# Patient Record
Sex: Male | Born: 1990
Health system: Southern US, Community
[De-identification: ages and names within clinical notes are randomized; demographics above are authoritative.]

## PROBLEM LIST (undated history)

## (undated) DIAGNOSIS — E669 Obesity, unspecified: Secondary | ICD-10-CM

## (undated) HISTORY — DX: Obesity, unspecified: E66.9

## (undated) HISTORY — PX: REPAIR CRANIAL DEFECT SIMPLE: SUR683

---
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
## 2013-05-31 HISTORY — PX: PILONIDAL CYST EXCISION: SHX744

## 2013-12-26 ENCOUNTER — Ambulatory Visit: Payer: Self-pay | Admitting: Surgery

## 2013-12-28 LAB — PATHOLOGY REPORT

## 2014-09-21 NOTE — Op Note (Signed)
PATIENT NAME:  Gerald Russell, Gerald Russell MR#:  [id number] DATE OF BIRTH:  04/24/1991  DATE OF PROCEDURE:  12/26/2013  OPERATION PERFORMED: Excision of pilonidal disease.   SURGEON: Claude MangesWilliam F Gwynneth Fabio, M.D.   ANESTHESIA: General.   PREOPERATIVE DIAGNOSIS: Pilonidal disease.   POSTOPERATIVE DIAGNOSIS:  Pilonidal disease.   PROCEDURE IN DETAIL: The patient was placed prone on the operating room table and prepped and draped in the usual sterile fashion. A narrow incision was made that encompassed all of the pilonidal disease including the multiple pits and the extruding hairs and the area of granulation tissue where he had spontaneously drained superiorly. This was carried down with a narrow excision of the disease, but included all of the disease (particularly where the hairs were slightly to the left superiorly) down to the sacral fascia. All of the disease was excised. Hemostasis was achieved with electrocautery. The wound was irrigated with copious amounts of warm normal saline. This was suctioned clear. There was no evidence of pus or purulence or soft tissue infection anywhere. Therefore the wound was closed in 3 layers with interrupted 3-0 Monocryl sutures deep, a running 3-0 Monocryl suture superficially, and a running subcuticular 3-0 PDS for the skin. Liquiderm was then added as the ultimate dressing. The patient tolerated the procedure well. There were no complications.    ____________________________ Claude MangesWilliam F. Saprina Chuong, MD wfm:ts D: 12/26/2013 09:21:08 ET T: 12/26/2013 11:05:28 ET JOB#: [id number]  cc: Claude MangesWilliam F. Claudie Brickhouse, MD, <Dictator> Claude MangesWILLIAM F Lige Lakeman MD ELECTRONICALLY SIGNED 12/27/2013 14:16

## 2015-05-22 ENCOUNTER — Encounter: Payer: Self-pay | Admitting: Family Medicine

## 2015-05-22 ENCOUNTER — Ambulatory Visit (INDEPENDENT_AMBULATORY_CARE_PROVIDER_SITE_OTHER): Payer: 59 | Admitting: Family Medicine

## 2015-05-22 VITALS — BP 120/92 | HR 72 | Ht 73.0 in | Wt 302.0 lb

## 2015-05-22 DIAGNOSIS — L600 Ingrowing nail: Secondary | ICD-10-CM | POA: Diagnosis not present

## 2015-05-22 MED ORDER — AMOXICILLIN-POT CLAVULANATE 875-125 MG PO TABS: 1.0000 | ORAL_TABLET | Freq: Two times a day (BID) | ORAL | Status: DC

## 2015-05-22 NOTE — Progress Notes (Signed)
Name: Gerald Russell   MRN: [id number]    DOB: 10/16/1990   Date:05/22/2015       Progress Note  Subjective  Chief Complaint  Chief Complaint  Patient presents with  . Toe Pain    Great toe on R) foot is infected x 2 weeks    Toe Pain  The incident occurred 5 to 7 days ago. There was no injury mechanism. The pain is present in the right toes. The pain is moderate. The pain has been fluctuating since onset. Pertinent negatives include no inability to bear weight, loss of motion, loss of sensation, muscle weakness, numbness or tingling. He has tried nothing for the symptoms. The treatment provided no relief.    No problem-specific assessment & plan notes found for this encounter.   No past medical history on file.  No past surgical history on file.  No family history on file.  Social History   Social History  . Marital Status: Single    Spouse Name: N/A  . Number of Children: N/A  . Years of Education: N/A   Occupational History  . Not on file.   Social History Main Topics  . Smoking status: Never Smoker   . Smokeless tobacco: Not on file  . Alcohol Use: 0.0 oz/week    0 Standard drinks or equivalent per week  . Drug Use: No  . Sexual Activity: Not on file   Other Topics Concern  . Not on file   Social History Narrative  . No narrative on file    Allergies  Allergen Reactions  . Hydrocodone      Review of Systems  Constitutional: Negative for fever, chills, weight loss and malaise/fatigue.  HENT: Negative for ear discharge, ear pain and sore throat.   Eyes: Negative for blurred vision.  Respiratory: Negative for cough, sputum production, shortness of breath and wheezing.   Cardiovascular: Negative for chest pain, palpitations and leg swelling.  Gastrointestinal: Negative for heartburn, nausea, abdominal pain, diarrhea, constipation, blood in stool and melena.  Genitourinary: Negative for dysuria, urgency, frequency and hematuria.  Musculoskeletal:  Negative for myalgias, back pain, joint pain and neck pain.  Skin: Negative for rash.  Neurological: Negative for dizziness, tingling, sensory change, focal weakness, numbness and headaches.  Endo/Heme/Allergies: Negative for environmental allergies and polydipsia. Does not bruise/bleed easily.  Psychiatric/Behavioral: Negative for depression and suicidal ideas. The patient is not nervous/anxious and does not have insomnia.      Objective  Filed Vitals:   05/22/15 1535  BP: 120/92  Pulse: 72  Height: 6\' 1"  (1.854 m)  Weight: 302 lb (136.986 kg)    Physical Exam  Pulmonary/Chest: He has no wheezes. He has no rales.  Musculoskeletal: He exhibits tenderness.  Skin: There is erythema.  Right great toe  Nursing note and vitals reviewed.     Assessment & Plan  Problem List Items Addressed This Visit    None    Visit Diagnoses    Ingrown right greater toenail    -  Primary    Relevant Medications    amoxicillin-clavulanate (AUGMENTIN) 875-125 MG tablet         Dr. Hayden Rasmusseneanna Jayen [facility]  05/22/2015

## 2015-05-30 ENCOUNTER — Ambulatory Visit (INDEPENDENT_AMBULATORY_CARE_PROVIDER_SITE_OTHER): Payer: 59 | Admitting: Family Medicine

## 2015-05-30 ENCOUNTER — Encounter: Payer: Self-pay | Admitting: Family Medicine

## 2015-05-30 VITALS — BP 120/84 | HR 70 | Ht 73.0 in | Wt 304.0 lb

## 2015-05-30 DIAGNOSIS — L600 Ingrowing nail: Secondary | ICD-10-CM | POA: Diagnosis not present

## 2015-05-30 MED ORDER — AMOXICILLIN-POT CLAVULANATE 875-125 MG PO TABS: 1.0000 | ORAL_TABLET | Freq: Two times a day (BID) | ORAL | Status: DC

## 2015-05-30 NOTE — Progress Notes (Signed)
Name: Gerald Russell   MRN: [id number]    DOB: 12/16/1990   Date:05/30/2015       Progress Note  Subjective  Chief Complaint  Chief Complaint  Patient presents with  . Foot Pain    L) great toe infected    Foot Pain This is a new problem. The current episode started in the past 7 days. The problem occurs daily. The problem has been gradually worsening. Pertinent negatives include no abdominal pain, chest pain, chills, coughing, fever, headaches, myalgias, nausea, neck pain, numbness, rash, sore throat or weakness. The symptoms are aggravated by walking (blunt trama). The treatment provided no relief.    No problem-specific assessment & plan notes found for this encounter.   History reviewed. No pertinent past medical history.  History reviewed. No pertinent past surgical history.  Family History  Problem Relation Age of Onset  . Cancer Father     Social History   Social History  . Marital Status: Single    Spouse Name: N/A  . Number of Children: N/A  . Years of Education: N/A   Occupational History  . Not on file.   Social History Main Topics  . Smoking status: Never Smoker   . Smokeless tobacco: Not on file  . Alcohol Use: 0.0 oz/week    0 Standard drinks or equivalent per week  . Drug Use: No  . Sexual Activity: Yes   Other Topics Concern  . Not on file   Social History Narrative    Allergies  Allergen Reactions  . Hydrocodone      Review of Systems  Constitutional: Negative for fever, chills, weight loss and malaise/fatigue.  HENT: Negative for ear discharge, ear pain and sore throat.   Eyes: Negative for blurred vision.  Respiratory: Negative for cough, sputum production, shortness of breath and wheezing.   Cardiovascular: Negative for chest pain, palpitations and leg swelling.  Gastrointestinal: Negative for heartburn, nausea, abdominal pain, diarrhea, constipation, blood in stool and melena.  Genitourinary: Negative for dysuria, urgency,  frequency and hematuria.  Musculoskeletal: Negative for myalgias, back pain, joint pain and neck pain.  Skin: Negative for rash.  Neurological: Negative for dizziness, tingling, sensory change, focal weakness, weakness, numbness and headaches.  Endo/Heme/Allergies: Negative for environmental allergies and polydipsia. Does not bruise/bleed easily.  Psychiatric/Behavioral: Negative for depression and suicidal ideas. The patient is not nervous/anxious and does not have insomnia.      Objective  Filed Vitals:   05/30/15 1358  BP: 120/84  Pulse: 70  Height: 6\' 1"  (1.854 m)  Weight: 304 lb (137.893 kg)    Physical Exam  Constitutional: He is oriented to person, place, and time and well-developed, well-nourished, and in no distress.  HENT:  Head: Normocephalic.  Right Ear: External ear normal.  Left Ear: External ear normal.  Nose: Nose normal.  Mouth/Throat: Oropharynx is clear and moist.  Eyes: Conjunctivae and EOM are normal. Pupils are equal, round, and reactive to light. Right eye exhibits no discharge. Left eye exhibits no discharge. No scleral icterus.  Neck: Normal range of motion. Neck supple. No JVD present. No tracheal deviation present. No thyromegaly present.  Cardiovascular: Normal rate, regular rhythm, normal heart sounds and intact distal pulses.  Exam reveals no gallop and no friction rub.   No murmur heard. Pulmonary/Chest: Breath sounds normal. No respiratory distress. He has no wheezes. He has no rales.  Abdominal: Soft. Bowel sounds are normal. He exhibits no mass. There is no hepatosplenomegaly. There is no tenderness.  There is no rebound, no guarding and no CVA tenderness.  Musculoskeletal: Normal range of motion. He exhibits tenderness. He exhibits no edema.       Feet:  Lymphadenopathy:    He has no cervical adenopathy.  Neurological: He is alert and oriented to person, place, and time. He has normal sensation, normal strength, normal reflexes and intact  cranial nerves. No cranial nerve deficit.  Skin: Skin is warm. No rash noted.  Psychiatric: Mood and affect normal.      Assessment & Plan  Problem List Items Addressed This Visit    None    Visit Diagnoses    Ingrown left greater toenail    -  Primary    Relevant Medications    amoxicillin-clavulanate (AUGMENTIN) 875-125 MG tablet    Ingrown right greater toenail        recheck         Dr. Elizabeth Sauer [facility]  05/30/2015

## 2015-08-18 ENCOUNTER — Encounter: Payer: Self-pay | Admitting: Family Medicine

## 2015-08-18 ENCOUNTER — Ambulatory Visit (INDEPENDENT_AMBULATORY_CARE_PROVIDER_SITE_OTHER): Payer: BLUE CROSS/BLUE SHIELD | Admitting: Family Medicine

## 2015-08-18 VITALS — BP 120/80 | HR 80 | Temp 98.7°F | Ht 73.0 in | Wt 306.0 lb

## 2015-08-18 DIAGNOSIS — J029 Acute pharyngitis, unspecified: Secondary | ICD-10-CM | POA: Diagnosis not present

## 2015-08-18 LAB — POCT RAPID STREP A (OFFICE): Rapid Strep A Screen: NEGATIVE

## 2015-08-18 MED ORDER — AZITHROMYCIN 250 MG PO TABS: ORAL_TABLET | ORAL | Status: DC

## 2015-08-18 NOTE — Progress Notes (Signed)
Name: Gerald Russell   MRN: [id number]    DOB: 07/03/1990   Date:08/18/2015       Progress Note  Subjective  Chief Complaint  Chief Complaint  Patient presents with  . Sore Throat    fever off and on x 5 days, loss of appetite- taking mucinex and alka-seltzer throughout day- helped some    Sore Throat  This is a new problem. The current episode started in the past 7 days. The problem has been gradually worsening. The maximum temperature recorded prior to his arrival was 100.4 - 100.9 F. The pain is at a severity of 3/10. The pain is mild. Associated symptoms include a hoarse voice. Pertinent negatives include no abdominal pain, coughing, diarrhea, ear discharge, ear pain, headaches, neck pain, shortness of breath, trouble swallowing or vomiting. Associated symptoms comments: nausea. He has had no exposure to strep. He has tried acetaminophen for the symptoms. The treatment provided mild relief.    No problem-specific assessment & plan notes found for this encounter.   History reviewed. No pertinent past medical history.  History reviewed. No pertinent past surgical history.  Family History  Problem Relation Age of Onset  . Cancer Father     Social History   Social History  . Marital Status: Single    Spouse Name: N/A  . Number of Children: N/A  . Years of Education: N/A   Occupational History  . Not on file.   Social History Main Topics  . Smoking status: Never Smoker   . Smokeless tobacco: Not on file  . Alcohol Use: 0.0 oz/week    0 Standard drinks or equivalent per week  . Drug Use: No  . Sexual Activity: Yes   Other Topics Concern  . Not on file   Social History Narrative    Allergies  Allergen Reactions  . Hydrocodone      Review of Systems  Constitutional: Negative for fever, chills, weight loss and malaise/fatigue.  HENT: Positive for hoarse voice. Negative for ear discharge, ear pain, sore throat and trouble swallowing.   Eyes: Negative for  blurred vision.  Respiratory: Negative for cough, sputum production, shortness of breath and wheezing.   Cardiovascular: Negative for chest pain, palpitations and leg swelling.  Gastrointestinal: Negative for heartburn, nausea, vomiting, abdominal pain, diarrhea, constipation, blood in stool and melena.  Genitourinary: Negative for dysuria, urgency, frequency and hematuria.  Musculoskeletal: Negative for myalgias, back pain, joint pain and neck pain.  Skin: Negative for rash.  Neurological: Negative for dizziness, tingling, sensory change, focal weakness and headaches.  Endo/Heme/Allergies: Negative for environmental allergies and polydipsia. Does not bruise/bleed easily.  Psychiatric/Behavioral: Negative for depression and suicidal ideas. The patient is not nervous/anxious and does not have insomnia.      Objective  Filed Vitals:   08/18/15 1524  BP: 120/80  Pulse: 80  Temp: 98.7 F (37.1 C)  TempSrc: Oral  Height: 6\' 1"  (1.854 m)  Weight: 306 lb (138.801 kg)    Physical Exam  Constitutional: He is oriented to person, place, and time and well-developed, well-nourished, and in no distress.  HENT:  Head: Normocephalic.  Right Ear: External ear normal.  Left Ear: External ear normal.  Nose: Nose normal.  Mouth/Throat: Oropharyngeal exudate and posterior oropharyngeal erythema present.  Eyes: Conjunctivae and EOM are normal. Pupils are equal, round, and reactive to light. Right eye exhibits no discharge. Left eye exhibits no discharge. No scleral icterus.  Neck: Normal range of motion. Neck supple. No JVD present.  No tracheal deviation present. No thyromegaly present.  Cardiovascular: Normal rate, regular rhythm, normal heart sounds and intact distal pulses.  Exam reveals no gallop and no friction rub.   No murmur heard. Pulmonary/Chest: Breath sounds normal. No respiratory distress. He has no wheezes. He has no rales.  Abdominal: Soft. Bowel sounds are normal. He exhibits no  mass. There is no hepatosplenomegaly. There is no tenderness. There is no rebound, no guarding and no CVA tenderness.  Musculoskeletal: Normal range of motion. He exhibits no edema or tenderness.  Lymphadenopathy:    He has no cervical adenopathy.  Neurological: He is alert and oriented to person, place, and time. He has normal sensation, normal strength, normal reflexes and intact cranial nerves. No cranial nerve deficit.  Skin: Skin is warm. No rash noted.  Psychiatric: Mood and affect normal.  Nursing note and vitals reviewed.     Assessment & Plan  Problem List Items Addressed This Visit    None    Visit Diagnoses    Pharyngitis    -  Primary    Relevant Medications    azithromycin (ZITHROMAX) 250 MG tablet    Other Relevant Orders    POCT rapid strep A (Completed)    Monospot         Dr. [facility]den [facility]  08/18/2015

## 2015-08-18 NOTE — Patient Instructions (Signed)
Infectious Mononucleosis °Infectious mononucleosis is an infection caused by a virus. This illness is often called "mono." It causes symptoms that affect various areas of the body, including the throat, upper air passages, and lymph glands. The liver or spleen may also be affected. °The virus spreads from person to person through close contact. The illness is usually not serious and often goes away in 2-4 weeks without treatment. In rare cases, symptoms can be more severe and last longer, sometimes up to several months. Because the illness can sometimes cause the liver or spleen to become enlarged, you should not participate in contact sports or strenuous exercise until your health care provider approves. °CAUSES  °Infectious mononucleosis is caused by the Epstein-Barr virus. This virus spreads through contact with an infected person's saliva or other bodily fluids. It is often spread through kissing. It may also spread through coughing or sharing utensils or drinking glasses that were recently used by an infected person. An infected person will not always appear ill but can still spread the virus. °RISK FACTORS °This illness is most common in adolescents and young adults. °SIGNS AND SYMPTOMS  °The most common symptoms of infectious mononucleosis are: °· Sore throat.   °· Headache.   °· Fatigue.   °· Muscle aches.   °· Swollen glands.   °· Fever.   °· Poor appetite.   °· Enlarged liver or spleen.   °Some less common symptoms that can also occur include: °· Rash. This is more common if you take antibiotic medicines. °· Feeling sick to your stomach (nauseous).   °· Abdominal pain.   °DIAGNOSIS  °Your health care provider will take your medical history and do a physical exam. Blood tests can be done to confirm the diagnosis.  °TREATMENT  °Infectious mononucleosis usually goes away on its own with time. It cannot be cured with medicines, but medicines are sometimes used to relieve symptoms. Steroid medicine is sometimes  needed if the swelling in the throat causes breathing or swallowing problems. Treatment in a hospital is sometimes needed for severe cases.  °HOME CARE INSTRUCTIONS  °· Rest as needed.   °· Do not participate in contact sports, strenuous exercise, or heavy lifting until your health care provider approves. The liver and spleen could be seriously injured if they are enlarged from the illness. You may need to wait a couple months before participating in sports.   °· Drink enough fluid to keep your urine clear or pale yellow.   °· Do not drink alcohol. °· Take medicines only as directed by your health care provider. Children under 18 years of age should not take aspirin because of the association with Reye syndrome.   °· Eat soft foods. Cold foods such as ice cream or frozen ice pops can soothe a sore throat. °· If you have a sore throat, gargle with a mixture of salt and water. This may help relieve your discomfort. Mix 1 tsp of salt in 1 cup of warm water. Sucking on hard candy may also help.   °· Start regular activities gradually after the fever is gone. Be sure to rest when tired.   °· Avoid kissing or sharing utensils or drinking glasses until your health care provider tells you that you are no longer contagious.   °PREVENTION  °To avoid spreading the virus, do not kiss anyone or share utensils, drinking glasses, or food until your health care provider tells you that you are no longer contagious. °SEEK MEDICAL CARE IF:  °· Your fever is not gone after 10 days. °· You have swollen lymph nodes that are not   back to normal after 4 weeks. °· Your activity level is not back to normal after 2 months.   °· You have yellow coloring to your eyes and skin (jaundice). °· You have constipation.   °SEEK IMMEDIATE MEDICAL CARE IF:  °· You have severe pain in the abdomen or shoulder. °· You are drooling. °· You have trouble swallowing. °· You have trouble breathing. °· You develop a stiff neck. °· You develop a severe  headache. °· You cannot stop throwing up (vomiting). °· You have convulsions. °· You are confused. °· You have trouble with balance. °· You have signs of dehydration. These may include: °¨ Weakness. °¨ Sunken eyes. °¨ Pale skin. °¨ Dry mouth. °¨ Rapid breathing or pulse. °  °This information is not intended to replace advice given to you by your health care provider. Make sure you discuss any questions you have with your health care provider. °  °Document Released: 05/14/2000 Document Revised: 06/07/2014 Document Reviewed: 01/22/2014 °Elsevier Interactive Patient Education ©2016 [employer] ° °

## 2015-08-19 LAB — MONONUCLEOSIS SCREEN: Mono Screen: NEGATIVE

## 2018-01-31 ENCOUNTER — Encounter: Payer: Self-pay | Admitting: Family Medicine

## 2018-01-31 ENCOUNTER — Ambulatory Visit: Payer: BLUE CROSS/BLUE SHIELD | Admitting: Family Medicine

## 2018-01-31 VITALS — BP 120/84 | HR 80 | Ht 73.0 in | Wt 294.0 lb

## 2018-01-31 DIAGNOSIS — L723 Sebaceous cyst: Secondary | ICD-10-CM

## 2018-01-31 DIAGNOSIS — Z7689 Persons encountering health services in other specified circumstances: Secondary | ICD-10-CM

## 2018-01-31 MED ORDER — MUPIROCIN 2 % EX OINT
1.0000 | TOPICAL_OINTMENT | Freq: Two times a day (BID) | CUTANEOUS | 0 refills | Status: DC
Start: 2018-01-31 — End: 2018-03-14

## 2018-01-31 MED ORDER — CEPHALEXIN 500 MG PO CAPS
500.0000 mg | ORAL_CAPSULE | Freq: Four times a day (QID) | ORAL | 0 refills | Status: DC
Start: 2018-01-31 — End: 2018-03-14

## 2018-01-31 NOTE — Progress Notes (Signed)
Name: Gerald Russell   MRN: [id number]    DOB: 1991/03/11   Date:01/31/2018       Progress Note  Subjective  Chief Complaint  Chief Complaint  Patient presents with  . Cyst    under L) armpit- "going down, doesn't hurt"    Patient noted sebaceous cyst in left axillae. Purulence expressed. Dressing applied.    No problem-specific Assessment & Plan notes found for this encounter.   History reviewed. No pertinent past medical history.  History reviewed. No pertinent surgical history.  Family History  Problem Relation Age of Onset  . Cancer Father     Social History   Socioeconomic History  . Marital status: Single    Spouse name: Not on file  . Number of children: Not on file  . Years of education: Not on file  . Highest education level: Not on file  Occupational History  . Not on file  Social Needs  . Financial resource strain: Not on file  . Food insecurity:    Worry: Not on file    Inability: Not on file  . Transportation needs:    Medical: Not on file    Non-medical: Not on file  Tobacco Use  . Smoking status: Never Smoker  . Smokeless tobacco: Never Used  Substance and Sexual Activity  . Alcohol use: Yes    Alcohol/week: 0.0 standard drinks  . Drug use: No  . Sexual activity: Yes  Lifestyle  . Physical activity:    Days per week: Not on file    Minutes per session: Not on file  . Stress: Not on file  Relationships  . Social connections:    Talks on phone: Not on file    Gets together: Not on file    Attends religious service: Not on file    Active member of club or organization: Not on file    Attends meetings of clubs or organizations: Not on file    Relationship status: Not on file  . Intimate partner violence:    Fear of current or ex partner: Not on file    Emotionally abused: Not on file    Physically abused: Not on file    Forced sexual activity: Not on file  Other Topics Concern  . Not on file  Social History Narrative  . Not on  file    Allergies  Allergen Reactions  . Hydrocodone     Outpatient Medications Prior to Visit  Medication Sig Dispense Refill  . azithromycin (ZITHROMAX) 250 MG tablet 2 today then 1 a day for 4 days 6 tablet 0   No facility-administered medications prior to visit.     Review of Systems  Constitutional: Negative for chills, fever, malaise/fatigue and weight loss.  HENT: Negative for ear discharge, ear pain and sore throat.   Eyes: Negative for blurred vision.  Respiratory: Negative for cough, sputum production, shortness of breath and wheezing.   Cardiovascular: Negative for chest pain, palpitations and leg swelling.  Gastrointestinal: Negative for abdominal pain, blood in stool, constipation, diarrhea, heartburn, melena and nausea.  Genitourinary: Negative for dysuria, frequency, hematuria and urgency.  Musculoskeletal: Negative for back pain, joint pain, myalgias and neck pain.  Skin: Negative for rash.  Neurological: Negative for dizziness, tingling, sensory change, focal weakness and headaches.  Endo/Heme/Allergies: Negative for environmental allergies and polydipsia. Does not bruise/bleed easily.  Psychiatric/Behavioral: Negative for depression and suicidal ideas. The patient is not nervous/anxious and does not have insomnia.  Objective  Vitals:   01/31/18 1435  BP: 120/84  Pulse: 80  Weight: 294 lb (133.4 kg)  Height: 6\' 1"  (1.854 m)    Physical Exam  Constitutional: He is oriented to person, place, and time.  HENT:  Head: Normocephalic.  Right Ear: External ear normal.  Left Ear: External ear normal.  Nose: Nose normal.  Mouth/Throat: Oropharynx is clear and moist.  Eyes: Pupils are equal, round, and reactive to light. Conjunctivae and EOM are normal. Right eye exhibits no discharge. Left eye exhibits no discharge. No scleral icterus.  Neck: Normal range of motion. Neck supple. No JVD present. No tracheal deviation present. No thyromegaly present.   Cardiovascular: Normal rate, regular rhythm, normal heart sounds and intact distal pulses. Exam reveals no gallop and no friction rub.  No murmur heard. Pulmonary/Chest: Breath sounds normal. No respiratory distress. He has no wheezes. He has no rales.  Abdominal: Soft. Bowel sounds are normal. He exhibits no mass. There is no hepatosplenomegaly. There is no tenderness. There is no rebound, no guarding and no CVA tenderness.  Musculoskeletal: Normal range of motion. He exhibits no edema or tenderness.  Lymphadenopathy:    He has no cervical adenopathy.  Neurological: He is alert and oriented to person, place, and time. He has normal strength and normal reflexes. No cranial nerve deficit.  Skin: Skin is warm. No rash noted.  Nursing note and vitals reviewed.     Assessment & Plan  Problem List Items Addressed This Visit    None    Visit Diagnoses    Establishing care with new doctor, encounter for    -  Primary   Sebaceous cyst of left axilla       Small secondary infected sebaceous cyst note in left axillary area. Treat with cephalexin 500 mg qid/bactroban.   Relevant Medications   cephALEXin (KEFLEX) 500 MG capsule   mupirocin ointment (BACTROBAN) 2 %      Meds ordered this encounter  Medications  . cephALEXin (KEFLEX) 500 MG capsule    Sig: Take 1 capsule (500 mg total) by mouth 4 (four) times daily.    Dispense:  20 capsule    Refill:  0  . mupirocin ointment (BACTROBAN) 2 %    Sig: Apply 1 application topically 2 (two) times daily.    Dispense:  22 g    Refill:  0      Dr. Hayden [facility]  01/31/18

## 2018-03-14 ENCOUNTER — Other Ambulatory Visit: Payer: Self-pay

## 2018-03-14 ENCOUNTER — Encounter: Payer: Self-pay | Admitting: Emergency Medicine

## 2018-03-14 ENCOUNTER — Ambulatory Visit
Admission: EM | Admit: 2018-03-14 | Discharge: 2018-03-14 | Disposition: A | Discharge status: Home / Self Care | Payer: BLUE CROSS/BLUE SHIELD | Attending: Family Medicine | Admitting: Family Medicine

## 2018-03-14 DIAGNOSIS — B354 Tinea corporis: Secondary | ICD-10-CM

## 2018-03-14 MED ORDER — CLOTRIMAZOLE 1 % EX CREA: TOPICAL_CREAM | CUTANEOUS | 0 refills | Status: DC

## 2018-03-14 NOTE — Discharge Instructions (Signed)
Topical treatment for 2 weeks.  Take care  Dr. Adriana Simas

## 2018-03-14 NOTE — ED Provider Notes (Signed)
[facility]  CSN: [id number] Arrival date & time: 03/14/18  1504  History   Chief Complaint Chief Complaint  Patient presents with  . Rash   HPI  27 year old male presents with rash.  Patient noted an area on the center of his chest on Saturday.  Round.  Slightly raised.  No itching or pain.  No new exposures or changes.  No medications or interventions tried.  No other associated symptoms.  No other complaints.  Hx reviewed and updated as below. PMH: Hx of ingrown toenail, Sebaceous cyst  Family History Family History  Problem Relation Age of Onset  . Cancer Father    Social History Social History   Tobacco Use  . Smoking status: Never Smoker  . Smokeless tobacco: Never Used  Substance Use Topics  . Alcohol use: Not Currently    Alcohol/week: 0.0 standard drinks  . Drug use: Yes    Types: Marijuana   Allergies   Hydrocodone  Review of Systems Review of Systems  Constitutional: Negative.   Skin: Positive for rash.   Physical Exam Triage Vital Signs ED Triage Vitals  Enc Vitals Group     BP 03/14/18 1520 122/88     Pulse Rate 03/14/18 1520 83     Resp 03/14/18 1520 18     Temp 03/14/18 1520 98.8 F (37.1 C)     Temp Source 03/14/18 1520 Oral     SpO2 03/14/18 1520 100 %     Weight 03/14/18 1521 280 lb (127 kg)     Height 03/14/18 1521 6\' 2"  (1.88 m)     Head Circumference --      Peak Flow --      Pain Score 03/14/18 1521 0     Pain Loc --      Pain Edu? --      Excl. in GC? --    Updated Vital Signs BP 122/88 (BP Location: Left Arm)   Pulse 83   Temp 98.8 F (37.1 C) (Oral)   Resp 18   Ht 6\' 2"  (1.88 m)   Wt 127 kg   SpO2 100%   BMI 35.95 kg/m   Visual Acuity Right Eye Distance:   Left Eye Distance:   Bilateral Distance:    Right Eye Near:   Left Eye Near:    Bilateral Near:     Physical Exam  Constitutional: He is oriented to person, place, and time. He appears well-developed. No distress.  HENT:  Head:  Normocephalic and atraumatic.  Cardiovascular: Normal rate and regular rhythm.  Pulmonary/Chest: Effort normal and breath sounds normal.  Neurological: He is alert and oriented to person, place, and time.  Skin:  Anterior chest - small circular lesion with raised border.  Psychiatric: He has a normal mood and affect. His behavior is normal.  Nursing note and vitals reviewed.  UC Treatments / Results  Labs (all labs ordered are listed, but only abnormal results are displayed) Labs Reviewed - No data to display  EKG None  Radiology No results found.  Procedures Procedures (including critical care time)  Medications Ordered in UC Medications - No data to display  Initial Impression / Assessment and Plan / UC Course  I have reviewed the triage vital signs and the nursing notes.  Pertinent labs & imaging results that were available during my care of the patient were reviewed by me and considered in my medical decision making (see chart for details).    26 year old male presents  with tinea corporis. Treating with topical clotrimazole.   Final Clinical Impressions(s) / UC Diagnoses   Final diagnoses:  Tinea corporis     Discharge Instructions     Topical treatment for 2 weeks.  Take care  Dr. Adriana Simas     ED Prescriptions    Medication Sig Dispense Auth. Provider   clotrimazole (LOTRIMIN) 1 % cream Apply to affected area 2 times daily 60 g Tommie Sams, DO     Controlled Substance Prescriptions  Controlled Substance Registry consulted? Not Applicable   Tommie Sams, DO 03/14/18 1740

## 2018-03-14 NOTE — ED Triage Notes (Signed)
Patient states he thinks he has ringworm on the middle of chest that started on Saturday.

## 2018-04-17 ENCOUNTER — Ambulatory Visit: Payer: BLUE CROSS/BLUE SHIELD | Admitting: Family Medicine

## 2018-04-17 ENCOUNTER — Encounter: Payer: Self-pay | Admitting: Family Medicine

## 2018-04-17 VITALS — BP 110/80 | HR 80 | Temp 103.0°F | Ht 74.0 in | Wt 277.0 lb

## 2018-04-17 DIAGNOSIS — R509 Fever, unspecified: Secondary | ICD-10-CM | POA: Diagnosis not present

## 2018-04-17 LAB — POCT URINALYSIS DIPSTICK
BILIRUBIN UA: NEGATIVE
Blood, UA: NEGATIVE
GLUCOSE UA: NEGATIVE
KETONES UA: NEGATIVE
Leukocytes, UA: NEGATIVE
Nitrite, UA: NEGATIVE
Protein, UA: POSITIVE — AB
Spec Grav, UA: 1.01 (ref 1.010–1.025)
UROBILINOGEN UA: 0.2 U/dL
pH, UA: 7 (ref 5.0–8.0)

## 2018-04-17 LAB — POCT INFLUENZA A/B
Influenza A, POC: NEGATIVE
Influenza B, POC: NEGATIVE

## 2018-04-17 LAB — POCT RAPID STREP A (OFFICE): Rapid Strep A Screen: POSITIVE — AB

## 2018-04-17 MED ORDER — OSELTAMIVIR PHOSPHATE 75 MG PO CAPS: 75.0000 mg | ORAL_CAPSULE | Freq: Two times a day (BID) | ORAL | 0 refills | Status: DC

## 2018-04-17 MED ORDER — AMOXICILLIN-POT CLAVULANATE 875-125 MG PO TABS: 1.0000 | ORAL_TABLET | Freq: Two times a day (BID) | ORAL | 0 refills | Status: DC

## 2018-04-17 NOTE — Progress Notes (Signed)
Date:  04/17/2018   Name:  Gerald NormanJoseph Crawford Russell   DOB:  08/07/1990   MRN:  [id number]   Chief Complaint: Cough (with green production/ pain on lower R) side and fever of 102) Cough  This is a new problem. The current episode started today. The problem has been unchanged. The cough is productive of purulent sputum (yellow/green). Associated symptoms include chest pain, chills, a fever, myalgias, postnasal drip, a sore throat, shortness of breath and sweats. Pertinent negatives include no ear congestion, ear pain, headaches, heartburn, hemoptysis, nasal congestion, rash, rhinorrhea, weight loss or wheezing. Associated symptoms comments: Right lateral pain. He has tried steroid inhaler for the symptoms. The treatment provided moderate relief. There is no history of asthma, bronchiectasis, bronchitis, COPD, emphysema, environmental allergies or pneumonia.  Fever   This is a new problem. The current episode started yesterday. The problem occurs constantly. The problem has been waxing and waning. The maximum temperature noted was 103 to 103.9 F. Associated symptoms include chest pain, coughing and a sore throat. Pertinent negatives include no abdominal pain, diarrhea, ear pain, headaches, nausea, rash, urinary pain or wheezing. The treatment provided moderate relief.  Risk factors comment:  "popped a bump" inside right thigh. on wednesday Sore Throat   This is a new problem. The current episode started yesterday. The problem has been gradually worsening. The pain is worse on the right side. The maximum temperature recorded prior to his arrival was 103 - 104 F. The fever has been present for less than 1 day. The pain is at a severity of 3/10. The pain is mild. Associated symptoms include coughing and shortness of breath. Pertinent negatives include no abdominal pain, diarrhea, drooling, ear discharge, ear pain, headaches or neck pain.     Review of Systems  Constitutional: Positive for chills and  fever. Negative for weight loss.  HENT: Positive for postnasal drip and sore throat. Negative for drooling, ear discharge, ear pain, facial swelling and rhinorrhea.   Respiratory: Positive for cough and shortness of breath. Negative for hemoptysis and wheezing.   Cardiovascular: Positive for chest pain. Negative for palpitations and leg swelling.  Gastrointestinal: Negative for abdominal pain, blood in stool, constipation, diarrhea, heartburn and nausea.  Endocrine: Negative for polydipsia.  Genitourinary: Negative for dysuria, frequency, hematuria and urgency.  Musculoskeletal: Positive for myalgias. Negative for back pain and neck pain.  Skin: Negative for rash.  Allergic/Immunologic: Negative for environmental allergies.  Neurological: Negative for dizziness and headaches.  Hematological: Does not bruise/bleed easily.  Psychiatric/Behavioral: Negative for suicidal ideas. The patient is not nervous/anxious.     There are no active problems to display for this patient.   Allergies  Allergen Reactions  . Hydrocodone     No past surgical history on file.  Social History   Tobacco Use  . Smoking status: Never Smoker  . Smokeless tobacco: Never Used  Substance Use Topics  . Alcohol use: Not Currently    Alcohol/week: 0.0 standard drinks  . Drug use: Yes    Types: Marijuana     Medication list has been reviewed and updated.  No outpatient medications have been marked as taking for the 04/17/18 encounter (Office Visit) with Duanne LimerickJones, Nhu Glasby C, MD.    [facility] 2/9 Scores 01/31/2018 08/18/2015 05/30/2015  PHQ - 2 Score 0 0 0  PHQ- 9 Score 0 - -    Physical Exam  Constitutional: He is oriented to person, place, and time. Vital signs are normal. He appears well-developed and well-nourished.  HENT:  Head: Normocephalic.  Right Ear: Hearing, tympanic membrane, external ear and ear canal normal.  Left Ear: Hearing, tympanic membrane, external ear and ear canal normal.  Nose: Nose  normal. No mucosal edema. Right sinus exhibits no maxillary sinus tenderness and no frontal sinus tenderness. Left sinus exhibits no maxillary sinus tenderness and no frontal sinus tenderness.  Mouth/Throat: Uvula is midline. Posterior oropharyngeal erythema present. No oropharyngeal exudate or posterior oropharyngeal edema.  Eyes: Pupils are equal, round, and reactive to light. Conjunctivae and EOM are normal. Right eye exhibits no discharge. Left eye exhibits no discharge. No scleral icterus.  Neck: Normal range of motion and full passive range of motion without pain. Neck supple. Normal carotid pulses, no hepatojugular reflux and no JVD present. Carotid bruit is not present. No neck rigidity. No tracheal deviation present. No thyroid mass and no thyromegaly present.  Cardiovascular: Normal rate, regular rhythm, S1 normal, S2 normal, normal heart sounds, intact distal pulses and normal pulses. PMI is not displaced. Exam reveals no gallop, no S3, no S4, no distant heart sounds and no friction rub.  No murmur heard. Pulmonary/Chest: Breath sounds normal. No stridor. No respiratory distress. He has no decreased breath sounds. He has no wheezes. He has no rhonchi. He has no rales.  Abdominal: Soft. Normal appearance and bowel sounds are normal. He exhibits no mass. There is no hepatosplenomegaly. There is no tenderness. There is no rigidity, no rebound, no guarding and no CVA tenderness.  Musculoskeletal: Normal range of motion. He exhibits no edema or tenderness.  Lymphadenopathy:       Head (right side): Submandibular (tender) adenopathy present.       Head (left side): No submandibular adenopathy present.    He has cervical adenopathy.       Right cervical: Superficial cervical adenopathy present.       Left cervical: No superficial cervical adenopathy present.  Neurological: He is alert and oriented to person, place, and time. He has normal strength and normal reflexes. He is not disoriented. No  cranial nerve deficit or sensory deficit.  Skin: Skin is warm and dry. No rash noted. No erythema. No pallor.  Nursing note and vitals reviewed.   BP 110/80   Pulse 80   Temp (!) 103 F (39.4 C) (Oral)   Ht 6\' 2"  (1.88 m)   Wt 277 lb (125.6 kg)   BMI 35.56 kg/m   Assessment and Plan:  1. Fever and chills Onset of fever and chills yesterday.  Fever in the 103 range rapid strep positive stick negative, and influenza AB- we will cover with Augmentin twice daily for 10 days and will cover with Tamiflu 75 mg 3 times daily due to clinical presentation. - POCT rapid strep A - POCT urinalysis dipstick - POCT Influenza A/B - amoxicillin-clavulanate (AUGMENTIN) 875-125 MG tablet; Take 1 tablet by mouth 2 (two) times daily.  Dispense: 20 tablet; Refill: 0 - oseltamivir (TAMIFLU) 75 MG capsule; Take 1 capsule (75 mg total) by mouth 2 (two) times daily.  Dispense: 10 capsule; Refill: 0   Dr. [facility]  04/17/2018

## 2018-04-25 ENCOUNTER — Encounter: Payer: Self-pay | Admitting: Family Medicine

## 2018-04-25 ENCOUNTER — Other Ambulatory Visit
Admission: RE | Admit: 2018-04-25 | Discharge: 2018-04-25 | Disposition: A | Discharge status: Home / Self Care | Payer: BLUE CROSS/BLUE SHIELD | Source: Ambulatory Visit | Attending: Family Medicine | Admitting: Family Medicine

## 2018-04-25 ENCOUNTER — Ambulatory Visit
Admission: RE | Admit: 2018-04-25 | Discharge: 2018-04-25 | Disposition: A | Discharge status: Home / Self Care | Payer: BLUE CROSS/BLUE SHIELD | Source: Ambulatory Visit | Attending: Family Medicine | Admitting: Family Medicine

## 2018-04-25 ENCOUNTER — Ambulatory Visit: Payer: BLUE CROSS/BLUE SHIELD | Admitting: Family Medicine

## 2018-04-25 VITALS — BP 118/77 | HR 78 | Resp 16 | Ht 74.0 in | Wt 274.0 lb

## 2018-04-25 DIAGNOSIS — R1110 Vomiting, unspecified: Secondary | ICD-10-CM

## 2018-04-25 DIAGNOSIS — R509 Fever, unspecified: Secondary | ICD-10-CM | POA: Insufficient documentation

## 2018-04-25 DIAGNOSIS — J40 Bronchitis, not specified as acute or chronic: Secondary | ICD-10-CM

## 2018-04-25 DIAGNOSIS — R0602 Shortness of breath: Secondary | ICD-10-CM | POA: Insufficient documentation

## 2018-04-25 LAB — CBC WITH DIFFERENTIAL/PLATELET
ABS IMMATURE GRANULOCYTES: 0.09 10*3/uL — AB (ref 0.00–0.07)
BASOS ABS: 0.1 10*3/uL (ref 0.0–0.1)
Basophils Relative: 1 %
EOS PCT: 6 %
Eosinophils Absolute: 0.7 10*3/uL — ABNORMAL HIGH (ref 0.0–0.5)
HEMATOCRIT: 42.7 % (ref 39.0–52.0)
HEMOGLOBIN: 14.3 g/dL (ref 13.0–17.0)
IMMATURE GRANULOCYTES: 1 %
LYMPHS ABS: 2 10*3/uL (ref 0.7–4.0)
LYMPHS PCT: 16 %
MCH: 29.7 pg (ref 26.0–34.0)
MCHC: 33.5 g/dL (ref 30.0–36.0)
MCV: 88.6 fL (ref 80.0–100.0)
Monocytes Absolute: 1.1 10*3/uL — ABNORMAL HIGH (ref 0.1–1.0)
Monocytes Relative: 9 %
NEUTROS ABS: 8.3 10*3/uL — AB (ref 1.7–7.7)
NRBC: 0 % (ref 0.0–0.2)
Neutrophils Relative %: 67 %
Platelets: 417 10*3/uL — ABNORMAL HIGH (ref 150–400)
RBC: 4.82 MIL/uL (ref 4.22–5.81)
RDW: 11.9 % (ref 11.5–15.5)
WBC: 12.3 10*3/uL — ABNORMAL HIGH (ref 4.0–10.5)

## 2018-04-25 MED ORDER — DOXYCYCLINE HYCLATE 100 MG PO TABS: 100.0000 mg | ORAL_TABLET | Freq: Two times a day (BID) | ORAL | 0 refills | Status: DC

## 2018-04-25 NOTE — Progress Notes (Addendum)
Date:  04/25/2018   Name:  Gerald Russell   DOB:  03/27/1991   MRN:  [id number]   Chief Complaint: Cough and Emesis (threw up today after coughing ) Cough  This is a new problem. The current episode started 1 to 4 weeks ago. The problem has been gradually improving. The problem occurs every few minutes. The cough is productive of purulent sputum. Associated symptoms include chest pain, chills, a fever, myalgias, nasal congestion, shortness of breath and sweats. Pertinent negatives include no ear congestion, ear pain, headaches, hemoptysis, postnasal drip, rash, rhinorrhea, sore throat or wheezing. Treatments tried: augmentin/tamiflu. The treatment provided mild relief. There is no history of bronchitis or environmental allergies.  Emesis   This is a new problem. The current episode started today. The problem occurs intermittently. The problem has been gradually improving. The emesis has an appearance of bile. There has been no fever. The fever has been present for 3 to 4 days. Associated symptoms include chest pain, chills, coughing, a fever, myalgias and sweats. Pertinent negatives include no abdominal pain, diarrhea, dizziness or headaches.     Review of Systems  Constitutional: Positive for chills and fever.  HENT: Positive for congestion. Negative for drooling, ear discharge, ear pain, mouth sores, nosebleeds, postnasal drip, rhinorrhea, sinus pressure, sinus pain, sneezing, sore throat and trouble swallowing.   Respiratory: Positive for cough and shortness of breath. Negative for hemoptysis, choking, chest tightness, wheezing and stridor.   Cardiovascular: Positive for chest pain. Negative for palpitations and leg swelling.       Right lateral  Gastrointestinal: Positive for nausea and vomiting. Negative for abdominal pain, blood in stool, constipation and diarrhea.  Endocrine: Negative for polydipsia.  Genitourinary: Negative for dysuria, enuresis, frequency, hematuria and  urgency.  Musculoskeletal: Positive for myalgias. Negative for back pain, neck pain and neck stiffness.  Skin: Negative for rash.  Allergic/Immunologic: Negative for environmental allergies.  Neurological: Negative for dizziness and headaches.  Hematological: Does not bruise/bleed easily.  Psychiatric/Behavioral: Negative for suicidal ideas. The patient is not nervous/anxious.     There are no active problems to display for this patient.   Allergies  Allergen Reactions  . Hydrocodone     History reviewed. No pertinent surgical history.  Social History   Tobacco Use  . Smoking status: Never Smoker  . Smokeless tobacco: Never Used  Substance Use Topics  . Alcohol use: Not Currently    Alcohol/week: 0.0 standard drinks  . Drug use: Yes    Types: Marijuana     Medication list has been reviewed and updated.  Current Meds  Medication Sig  . [DISCONTINUED] amoxicillin-clavulanate (AUGMENTIN) 875-125 MG tablet Take 1 tablet by mouth 2 (two) times daily.    PHQ 2/9 Scores 01/31/2018 08/18/2015 05/30/2015  PHQ - 2 Score 0 0 0  PHQ- 9 Score 0 - -    Physical Exam  Constitutional: He is oriented to person, place, and time.  HENT:  Head: Normocephalic.  Right Ear: External ear normal.  Left Ear: External ear normal.  Nose: Nose normal.  Mouth/Throat: Oropharynx is clear and moist. No oropharyngeal exudate, posterior oropharyngeal edema or posterior oropharyngeal erythema.  Eyes: Pupils are equal, round, and reactive to light. Conjunctivae and EOM are normal. Right eye exhibits no discharge. Left eye exhibits no discharge. No scleral icterus.  Neck: Normal range of motion and full passive range of motion without pain. Neck supple. No hepatojugular reflux and no JVD present. No tracheal deviation present. No Brudzinski's  sign and no Kernig's sign noted. No thyroid mass and no thyromegaly present.  Cardiovascular: Normal rate, regular rhythm, normal heart sounds and intact distal  pulses. Exam reveals no gallop and no friction rub.  No murmur heard. Pulmonary/Chest: Effort normal. No accessory muscle usage or stridor. No apnea and no tachypnea. No respiratory distress. He has decreased breath sounds in the left lower field. He has no wheezes. He has no rhonchi. He has no rales. He exhibits no tenderness.  Abdominal: Soft. Bowel sounds are normal. He exhibits no mass. There is no hepatosplenomegaly. There is no tenderness. There is no rigidity, no rebound, no guarding and no CVA tenderness.  Musculoskeletal: Normal range of motion. He exhibits no edema or tenderness.  Lymphadenopathy:       Head (right side): No submandibular and no tonsillar adenopathy present.       Head (left side): No submandibular and no tonsillar adenopathy present.    He has no cervical adenopathy.  Neurological: He is alert and oriented to person, place, and time. He has normal strength and normal reflexes. No cranial nerve deficit.  Skin: Skin is warm, dry and intact. No rash noted. No pallor.  Psychiatric: He has a normal mood and affect.  alert  Nursing note and vitals reviewed.   BP 118/77   Pulse 78   Resp 16   Ht 6\' 2"  (1.88 m)   Wt 274 lb (124.3 kg)   SpO2 98%   BMI 35.18 kg/m   Assessment and Plan: 1. Fever and chills Acute Persistent Will obtain cbc(wbc 12,000) and chest film (no pneumonia). Will switch to doxycycline 100 mg bid. Recheck Monday. - DG Chest 2 View; Future - doxycycline (VIBRA-TABS) 100 MG tablet; Take 1 tablet (100 mg total) by mouth 2 (two) times daily.  Dispense: 20 tablet; Refill: 0  2. Bronchitis Will switch to doxycycline 100 mg bid. I spent 40 minutes with this patient, More than 50% of that time was spent in face to face education, counseling and care coordination.    Dr. [facility]  04/25/2018

## 2018-05-01 ENCOUNTER — Ambulatory Visit: Payer: BLUE CROSS/BLUE SHIELD | Admitting: Family Medicine

## 2018-05-01 ENCOUNTER — Encounter: Payer: Self-pay | Admitting: Family Medicine

## 2018-05-01 VITALS — BP 120/80 | HR 104 | Ht 74.0 in | Wt 259.0 lb

## 2018-05-01 DIAGNOSIS — E860 Dehydration: Secondary | ICD-10-CM

## 2018-05-01 DIAGNOSIS — R509 Fever, unspecified: Secondary | ICD-10-CM | POA: Diagnosis not present

## 2018-05-01 DIAGNOSIS — J029 Acute pharyngitis, unspecified: Secondary | ICD-10-CM

## 2018-05-01 DIAGNOSIS — D72829 Elevated white blood cell count, unspecified: Secondary | ICD-10-CM

## 2018-05-01 DIAGNOSIS — R5383 Other fatigue: Secondary | ICD-10-CM | POA: Diagnosis not present

## 2018-05-01 DIAGNOSIS — R634 Abnormal weight loss: Secondary | ICD-10-CM

## 2018-05-01 NOTE — Progress Notes (Signed)
Date:  05/01/2018   Name:  Gerald Russell   DOB:  [birth year]   MRN:  [id number]   Chief Complaint: Follow-up (no fever, just still tired) and Otalgia (R) ear- feels better) Otalgia   There is pain in the right ear. This is a new problem. The current episode started in the past 7 days (wednesday night). The problem occurs every few minutes. The problem has been waxing and waning. There has been no fever. The pain is mild. Associated symptoms include diarrhea and a sore throat. Pertinent negatives include no abdominal pain, coughing, ear discharge, headaches, hearing loss, neck pain, rash, rhinorrhea or vomiting. The treatment provided mild relief.  Fever   This is a new problem. The current episode started 1 to 4 weeks ago. The problem occurs intermittently. The problem has been gradually improving. Associated symptoms include congestion, diarrhea, ear pain and a sore throat. Pertinent negatives include no abdominal pain, chest pain, coughing, headaches, muscle aches, nausea, rash, urinary pain, vomiting or wheezing. Treatments tried: doxycycline. The treatment provided mild relief.     Review of Systems  Constitutional: Positive for fatigue and fever. Negative for chills.  HENT: Positive for congestion, ear pain and sore throat. Negative for drooling, ear discharge, hearing loss and rhinorrhea.   Respiratory: Negative for cough, shortness of breath and wheezing.   Cardiovascular: Negative for chest pain, palpitations and leg swelling.  Gastrointestinal: Positive for diarrhea. Negative for abdominal pain, blood in stool, constipation, nausea and vomiting.  Endocrine: Negative for polydipsia.  Genitourinary: Negative for dysuria, frequency, hematuria and urgency.  Musculoskeletal: Negative for back pain, myalgias and neck pain.  Skin: Negative for rash.  Allergic/Immunologic: Negative for environmental allergies.  Neurological: Negative for dizziness and headaches.  Hematological:  Does not bruise/bleed easily.  Psychiatric/Behavioral: Negative for suicidal ideas. The patient is not nervous/anxious.     There are no active problems to display for this patient.   Allergies  Allergen Reactions  . Hydrocodone     No past surgical history on file.  Social History   Tobacco Use  . Smoking status: Never Smoker  . Smokeless tobacco: Never Used  Substance Use Topics  . Alcohol use: Not Currently    Alcohol/week: 0.0 standard drinks  . Drug use: Yes    Types: Marijuana     Medication list has been reviewed and updated.  Current Meds  Medication Sig  . doxycycline (VIBRA-TABS) 100 MG tablet Take 1 tablet (100 mg total) by mouth 2 (two) times daily.    PHQ 2/9 Scores 01/31/2018 08/18/2015 05/30/2015  PHQ - 2 Score 0 0 0  PHQ- 9 Score 0 - -    Physical Exam  Constitutional: He is oriented to person, place, and time. He appears well-developed and well-nourished.  HENT:  Head: Normocephalic.  Right Ear: External ear normal. Tympanic membrane is erythematous and retracted.  Left Ear: Hearing, external ear and ear canal normal. Tympanic membrane is retracted.  Nose: Right sinus exhibits maxillary sinus tenderness. Left sinus exhibits maxillary sinus tenderness.  Mouth/Throat: Uvula is midline. Mucous membranes are dry. Oropharyngeal exudate and posterior oropharyngeal erythema present. No posterior oropharyngeal edema.  Eyes: Pupils are equal, round, and reactive to light. Conjunctivae and EOM are normal. Right eye exhibits no discharge. Left eye exhibits no discharge. No scleral icterus.  Neck: Normal range of motion. Neck supple. No JVD present. No tracheal deviation present. No thyromegaly present.  Cardiovascular: Normal rate, regular rhythm, normal heart sounds and intact distal pulses.  Exam reveals no gallop and no friction rub.  No murmur heard. Pulmonary/Chest: Breath sounds normal. No respiratory distress. He has no wheezes. He has no rales.    Abdominal: Soft. Bowel sounds are normal. He exhibits no mass. There is no hepatosplenomegaly. There is no tenderness. There is no rebound, no guarding and no CVA tenderness.  Musculoskeletal: Normal range of motion. He exhibits no edema or tenderness.  Lymphadenopathy:       Head (right side): No submandibular adenopathy present.       Head (left side): No submandibular adenopathy present.    He has no cervical adenopathy.       Right cervical: No superficial cervical adenopathy present.      Left cervical: No superficial cervical adenopathy present.  Neurological: He is alert and oriented to person, place, and time. He has normal strength and normal reflexes. No cranial nerve deficit.  Skin: Skin is warm, dry and intact. No rash noted. He is not diaphoretic. No pallor.  Nursing note and vitals reviewed.   BP 120/80   Pulse (!) 104   Ht 6\' 2"  (1.88 m)   Wt 259 lb (117.5 kg)   BMI 33.25 kg/m   Assessment and Plan:  1. Febrile illness Resolved.  Will check CBC with differential. - CBC with Differential/Platelet  2. Pharyngitis, unspecified etiology Continued but milder.  Exudate noted on examination will check mono test. - Mononucleosis Test, Qual W/ Reflex  3. Leukocytosis, unspecified type Patient was noted to have leukocytosis at 12 point 5K on last visit.  Will recheck CBC. - CBC with Differential/Platelet  4. Fatigue, unspecified type Persistent fatigue without dyspnea on exertion nor or syncope.  Check mono test. - Mononucleosis Test, Qual W/ Reflex  5. Dehydration Patient has had a 10 pound weight loss over the past week but appetite has improved and mucous membranes were noted to be dry.  Fluid  intake increase and encouraged.  6. Weight loss As noted above 10 pound weight loss patient was encouraged to increase fluids recheck if needed.   Dr. Hayden Rasmusseneanna Leonides [facility]  05/01/2018

## 2018-05-02 LAB — CBC WITH DIFFERENTIAL/PLATELET
BASOS: 1 %
Basophils Absolute: 0.1 10*3/uL (ref 0.0–0.2)
EOS (ABSOLUTE): 0.2 10*3/uL (ref 0.0–0.4)
EOS: 2 %
HEMATOCRIT: 45.1 % (ref 37.5–51.0)
Hemoglobin: 16.1 g/dL (ref 13.0–17.7)
Immature Grans (Abs): 0.1 10*3/uL (ref 0.0–0.1)
Immature Granulocytes: 1 %
LYMPHS ABS: 3.3 10*3/uL — AB (ref 0.7–3.1)
Lymphs: 37 %
MCH: 31.6 pg (ref 26.6–33.0)
MCHC: 35.7 g/dL (ref 31.5–35.7)
MCV: 88 fL (ref 79–97)
MONOS ABS: 0.7 10*3/uL (ref 0.1–0.9)
Monocytes: 7 %
Neutrophils Absolute: 4.6 10*3/uL (ref 1.4–7.0)
Neutrophils: 52 %
Platelets: 618 10*3/uL — ABNORMAL HIGH (ref 150–450)
RBC: 5.1 x10E6/uL (ref 4.14–5.80)
RDW: 12.5 % (ref 12.3–15.4)
WBC: 9 10*3/uL (ref 3.4–10.8)

## 2018-05-02 LAB — MONO QUAL W/RFLX QN: Mono Qual W/Rflx Qn: NEGATIVE

## 2019-04-30 ENCOUNTER — Other Ambulatory Visit: Payer: Self-pay

## 2019-04-30 ENCOUNTER — Ambulatory Visit (INDEPENDENT_AMBULATORY_CARE_PROVIDER_SITE_OTHER): Payer: BLUE CROSS/BLUE SHIELD | Admitting: Family Medicine

## 2019-04-30 ENCOUNTER — Encounter: Payer: Self-pay | Admitting: Family Medicine

## 2019-04-30 VITALS — BP 120/80 | HR 80 | Ht 74.0 in | Wt 254.0 lb

## 2019-04-30 DIAGNOSIS — Z1211 Encounter for screening for malignant neoplasm of colon: Secondary | ICD-10-CM

## 2019-04-30 DIAGNOSIS — Z0000 Encounter for general adult medical examination without abnormal findings: Secondary | ICD-10-CM

## 2019-04-30 DIAGNOSIS — Z23 Encounter for immunization: Secondary | ICD-10-CM | POA: Diagnosis not present

## 2019-04-30 LAB — HEMOCCULT GUIAC POC 1CARD (OFFICE): Fecal Occult Blood, POC: NEGATIVE

## 2019-04-30 NOTE — Progress Notes (Signed)
Date:  04/30/2019   Name:  Gerald Russell   DOB:  April 08, 1991   MRN:  [id number]   Chief Complaint: Annual Exam and tdap need  Patient is a 28 year old male who presents for a comprehensive physical exam. The patient reports the following problems: none. Health maintenance has been reviewed up to date.   No results found for: CREATININE, BUN, NA, K, CL, CO2 No results found for: CHOL, HDL, LDLCALC, LDLDIRECT, TRIG, CHOLHDL No results found for: TSH No results found for: HGBA1C   Review of Systems  Constitutional: Negative for chills and fever.  HENT: Negative for drooling, ear discharge, ear pain and sore throat.   Respiratory: Negative for cough, shortness of breath and wheezing.   Cardiovascular: Negative for chest pain, palpitations and leg swelling.  Gastrointestinal: Negative for abdominal pain, blood in stool, constipation, diarrhea and nausea.  Endocrine: Negative for polydipsia.  Genitourinary: Negative for dysuria, frequency, hematuria and urgency.  Musculoskeletal: Negative for back pain, myalgias and neck pain.  Skin: Negative for rash.  Allergic/Immunologic: Negative for environmental allergies.  Neurological: Negative for dizziness and headaches.  Hematological: Does not bruise/bleed easily.  Psychiatric/Behavioral: Negative for suicidal ideas. The patient is not nervous/anxious.     There are no active problems to display for this patient.   Allergies  Allergen Reactions  . Hydrocodone     No past surgical history on file.  Social History   Tobacco Use  . Smoking status: Never Smoker  . Smokeless tobacco: Never Used  Substance Use Topics  . Alcohol use: Not Currently    Alcohol/week: 0.0 standard drinks  . Drug use: Yes    Types: Marijuana     Medication list has been reviewed and updated.  No outpatient medications have been marked as taking for the 04/30/19 encounter (Office Visit) with Duanne Limerick, MD.    [facility] 2/9 Scores  04/30/2019 01/31/2018 08/18/2015 05/30/2015  PHQ - 2 Score 0 0 0 0  PHQ- 9 Score 0 0 - -    BP Readings from Last 3 Encounters:  04/30/19 120/80  05/01/18 120/80  04/25/18 118/77    Physical Exam Vitals signs and nursing note reviewed.  HENT:     Head: Normocephalic.     Right Ear: Tympanic membrane and external ear normal.     Left Ear: Tympanic membrane and external ear normal.     Nose: Nose normal.  Eyes:     General: No scleral icterus.       Right eye: No discharge.        Left eye: No discharge.     Conjunctiva/sclera: Conjunctivae normal.     Pupils: Pupils are equal, round, and reactive to light.  Neck:     Musculoskeletal: Normal range of motion and neck supple.     Thyroid: No thyromegaly.     Vascular: No JVD.     Trachea: No tracheal deviation.  Cardiovascular:     Rate and Rhythm: Normal rate and regular rhythm.     Heart sounds: Normal heart sounds. No murmur. No friction rub. No gallop.   Pulmonary:     Effort: No respiratory distress.     Breath sounds: Normal breath sounds. No wheezing or rales.  Abdominal:     General: Bowel sounds are normal.     Palpations: Abdomen is soft. There is no mass.     Tenderness: There is no abdominal tenderness. There is no guarding or rebound.  Musculoskeletal: Normal  range of motion.        General: No tenderness.  Lymphadenopathy:     Cervical: No cervical adenopathy.  Skin:    General: Skin is warm.     Findings: No rash.  Neurological:     Mental Status: He is alert and oriented to person, place, and time.     Cranial Nerves: No cranial nerve deficit.     Deep Tendon Reflexes: Reflexes are normal and symmetric.     Wt Readings from Last 3 Encounters:  04/30/19 254 lb (115.2 kg)  05/01/18 259 lb (117.5 kg)  04/25/18 274 lb (124.3 kg)    BP 120/80   Pulse 80   Ht 6\' 2"  (1.88 m)   Wt 254 lb (115.2 kg)   BMI 32.61 kg/m   Assessment and Plan:  1. Annual physical exam No subjective/objective concerns  noted on physical exam.  Patient's previous encounters labs and imaging were reviewed we will obtain comprehensive metabolic panel and lipid panel.Gerald Russell is a 28 y.o. male who presents today for his Complete Annual Exam. He feels well. He reports exercising . He reports he is sleeping well.Immunizations are reviewed and recommendations provided.   Age appropriate screening tests are discussed. Counseling given for risk factor reduction interventions.  - Comprehensive metabolic panel - Lipid Panel With LDL/HDL Ratio - POCT Occult Blood Stool  2. Need for diphtheria-tetanus-pertussis (Tdap) vaccine Discussed and administered - Tdap vaccine greater than or equal to 7yo IM  3. Colon cancer screening Hemoccult was negative. - POCT Occult Blood Stool

## 2019-04-30 NOTE — Patient Instructions (Signed)

## 2019-05-01 LAB — COMPREHENSIVE METABOLIC PANEL
ALT: 21 IU/L (ref 0–44)
AST: 19 IU/L (ref 0–40)
Albumin/Globulin Ratio: 2 (ref 1.2–2.2)
Albumin: 5.1 g/dL (ref 4.1–5.2)
Alkaline Phosphatase: 95 IU/L (ref 39–117)
BUN/Creatinine Ratio: 17 (ref 9–20)
BUN: 16 mg/dL (ref 6–20)
Bilirubin Total: 1 mg/dL (ref 0.0–1.2)
CO2: 21 mmol/L (ref 20–29)
Calcium: 10.3 mg/dL — ABNORMAL HIGH (ref 8.7–10.2)
Chloride: 103 mmol/L (ref 96–106)
Creatinine, Ser: 0.93 mg/dL (ref 0.76–1.27)
GFR calc Af Amer: 131 mL/min/{1.73_m2} (ref >59)
GFR calc non Af Amer: 113 mL/min/{1.73_m2} (ref >59)
Globulin, Total: 2.6 g/dL (ref 1.5–4.5)
Glucose: 89 mg/dL (ref 65–99)
Potassium: 4.6 mmol/L (ref 3.5–5.2)
Sodium: 140 mmol/L (ref 134–144)
Total Protein: 7.7 g/dL (ref 6.0–8.5)

## 2019-05-01 LAB — LIPID PANEL WITH LDL/HDL RATIO
Cholesterol, Total: 144 mg/dL (ref 100–199)
HDL: 50 mg/dL (ref >39)
LDL Chol Calc (NIH): 79 mg/dL (ref 0–99)
LDL/HDL Ratio: 1.6 ratio (ref 0.0–3.6)
Triglycerides: 78 mg/dL (ref 0–149)
VLDL Cholesterol Cal: 15 mg/dL (ref 5–40)

## 2020-04-14 ENCOUNTER — Ambulatory Visit (INDEPENDENT_AMBULATORY_CARE_PROVIDER_SITE_OTHER): Payer: 59 | Admitting: Family Medicine

## 2020-04-14 ENCOUNTER — Other Ambulatory Visit: Payer: Self-pay

## 2020-04-14 ENCOUNTER — Encounter: Payer: Self-pay | Admitting: Family Medicine

## 2020-04-14 VITALS — BP 118/88 | HR 72 | Ht 74.0 in | Wt 232.0 lb

## 2020-04-14 DIAGNOSIS — Z23 Encounter for immunization: Secondary | ICD-10-CM

## 2020-04-14 DIAGNOSIS — S76912A Strain of unspecified muscles, fascia and tendons at thigh level, left thigh, initial encounter: Secondary | ICD-10-CM | POA: Diagnosis not present

## 2020-04-14 MED ORDER — MELOXICAM 15 MG PO TABS: 15.0000 mg | ORAL_TABLET | Freq: Every day | ORAL | 0 refills | Status: DC

## 2020-04-14 NOTE — Progress Notes (Signed)
Date:  04/14/2020   Name:  Gerald Russell   DOB:  09-29-1990   MRN:  [id number]   Chief Complaint: Testicle Pain (started hurting x 1 week ago- slowly getting better/ doesn't appear to be swollen) and Flu Vaccine  Testicle Pain The patient's primary symptoms include testicular pain. The patient's pertinent negatives include no genital injury, genital itching, genital lesions, pelvic pain, penile discharge, penile pain, priapism or scrotal swelling. The current episode started in the past 7 days (Sunday week ago). The problem occurs intermittently. The problem has been waxing and waning. Pertinent negatives include no abdominal pain, chest pain, chills, constipation, coughing, diarrhea, dysuria, fever, frequency, headaches, hematuria, hesitancy, nausea, rash, shortness of breath, sore throat or urgency. He has tried nothing for the symptoms. The treatment provided mild relief.    Lab Results  Component Value Date   CREATININE 0.93 04/30/2019   BUN 16 04/30/2019   NA 140 04/30/2019   K 4.6 04/30/2019   CL 103 04/30/2019   CO2 21 04/30/2019   Lab Results  Component Value Date   CHOL 144 04/30/2019   HDL 50 04/30/2019   LDLCALC 79 04/30/2019   TRIG 78 04/30/2019   No results found for: TSH No results found for: HGBA1C Lab Results  Component Value Date   WBC 9.0 05/01/2018   HGB 16.1 05/01/2018   HCT 45.1 05/01/2018   MCV 88 05/01/2018   PLT 618 (H) 05/01/2018   Lab Results  Component Value Date   ALT 21 04/30/2019   AST 19 04/30/2019   ALKPHOS 95 04/30/2019   BILITOT 1.0 04/30/2019     Review of Systems  Constitutional: Negative for chills and fever.  HENT: Negative for drooling, ear discharge, ear pain and sore throat.   Respiratory: Negative for cough, shortness of breath and wheezing.   Cardiovascular: Negative for chest pain, palpitations and leg swelling.  Gastrointestinal: Negative for abdominal pain, blood in stool, constipation, diarrhea and nausea.    Endocrine: Negative for polydipsia.  Genitourinary: Positive for testicular pain. Negative for discharge, dysuria, frequency, hematuria, hesitancy, pelvic pain, penile pain, scrotal swelling and urgency.  Musculoskeletal: Negative for back pain, myalgias and neck pain.  Skin: Negative for rash.  Allergic/Immunologic: Negative for environmental allergies.  Neurological: Negative for dizziness and headaches.  Hematological: Does not bruise/bleed easily.  Psychiatric/Behavioral: Negative for suicidal ideas. The patient is not nervous/anxious.     There are no problems to display for this patient.   Allergies  Allergen Reactions  . Hydrocodone     No past surgical history on file.  Social History   Tobacco Use  . Smoking status: Never Smoker  . Smokeless tobacco: Never Used  Vaping Use  . Vaping Use: Never used  Substance Use Topics  . Alcohol use: Not Currently    Alcohol/week: 0.0 standard drinks  . Drug use: Yes    Types: Marijuana     Medication list has been reviewed and updated.  No outpatient medications have been marked as taking for the 04/14/20 encounter (Office Visit) with Duanne Limerick, MD.    [facility] 2/9 Scores 04/14/2020 04/30/2019 01/31/2018 08/18/2015  PHQ - 2 Score 0 0 0 0  PHQ- 9 Score 0 0 0 -    GAD 7 : Generalized Anxiety Score 04/14/2020 04/30/2019  Nervous, Anxious, on Edge 0 1  Control/stop worrying 0 0  Worry too much - different things 0 0  Trouble relaxing 0 0  Restless 0 0  Easily  annoyed or irritable 0 0  Afraid - awful might happen 0 0  Total GAD 7 Score 0 1    BP Readings from Last 3 Encounters:  04/14/20 118/88  04/30/19 120/80  05/01/18 120/80    Physical Exam Vitals and nursing note reviewed.  HENT:     Head: Normocephalic.     Right Ear: External ear normal.     Left Ear: External ear normal.     Nose: Nose normal.  Eyes:     General: No scleral icterus.       Right eye: No discharge.        Left eye: No discharge.      Conjunctiva/sclera: Conjunctivae normal.     Pupils: Pupils are equal, round, and reactive to light.  Neck:     Thyroid: No thyromegaly.     Vascular: No JVD.     Trachea: No tracheal deviation.  Cardiovascular:     Rate and Rhythm: Normal rate and regular rhythm.     Heart sounds: Normal heart sounds. No murmur heard.  No friction rub. No gallop.   Pulmonary:     Effort: No respiratory distress.     Breath sounds: Normal breath sounds. No wheezing or rales.  Abdominal:     General: Bowel sounds are normal.     Palpations: Abdomen is soft. There is no mass.     Tenderness: There is no abdominal tenderness. There is no guarding or rebound.     Hernia: There is no hernia in the left inguinal area or right inguinal area.  Genitourinary:    Testes: Normal.        Right: Mass, tenderness, swelling, testicular hydrocele or varicocele not present. Right testis is descended.        Left: Mass, tenderness, swelling, testicular hydrocele or varicocele not present. Left testis is descended.     Epididymis:     Right: Normal. Not inflamed or enlarged. No mass or tenderness.     Left: Normal. Not inflamed or enlarged. No mass or tenderness.  Musculoskeletal:        General: Normal range of motion.     Cervical back: Normal range of motion and neck supple.     Left upper leg: Tenderness present. No swelling, edema, deformity, lacerations or bony tenderness.       Legs:  Lymphadenopathy:     Cervical: No cervical adenopathy.     Lower Body: No right inguinal adenopathy. No left inguinal adenopathy.  Skin:    General: Skin is warm.     Findings: No rash.  Neurological:     Mental Status: He is alert and oriented to person, place, and time.     Cranial Nerves: No cranial nerve deficit.     Deep Tendon Reflexes: Reflexes are normal and symmetric.     Wt Readings from Last 3 Encounters:  04/14/20 232 lb (105.2 kg)  04/30/19 254 lb (115.2 kg)  05/01/18 259 lb (117.5 kg)    BP 118/88    Pulse 72   Ht 6\' 2"  (1.88 m)   Wt 232 lb (105.2 kg)   BMI 29.79 kg/m   Assessment and Plan: Patient's chart was reviewed for previous encounter. 1. Strain of left iliopsoas muscle, initial encounter Patient's been doing more jogging lately and has been using compression pants.  Patient's had pain more in the inguinal iliopsoas area than in the scrotal area but was concerned given that this was at the base of his testicle.  Testicular exam was normal without any tenderness or no epididymal or inguinal canal discomfort.  Patient does have some tenderness over the iliopsoas and abductor more concerned about the musculoskeletal nature of this.  We will initiate meloxicam 15 mg once a day along with Tylenol as needed for supplementation. - meloxicam (MOBIC) 15 MG tablet; Take 1 tablet (15 mg total) by mouth daily.  Dispense: 30 tablet; Refill: 0  2. Need for immunization against influenza Discussed and administered - Flu Vaccine QUAD 36+ mos IM

## 2020-05-06 ENCOUNTER — Ambulatory Visit (INDEPENDENT_AMBULATORY_CARE_PROVIDER_SITE_OTHER): Payer: 59 | Admitting: Family Medicine

## 2020-05-06 ENCOUNTER — Encounter: Payer: Self-pay | Admitting: Family Medicine

## 2020-05-06 ENCOUNTER — Other Ambulatory Visit: Payer: Self-pay

## 2020-05-06 VITALS — BP 102/62 | HR 76 | Ht 74.0 in | Wt 232.0 lb

## 2020-05-06 DIAGNOSIS — Z0000 Encounter for general adult medical examination without abnormal findings: Secondary | ICD-10-CM

## 2020-05-06 DIAGNOSIS — K4020 Bilateral inguinal hernia, without obstruction or gangrene, not specified as recurrent: Secondary | ICD-10-CM

## 2020-05-06 MED ORDER — MELOXICAM 15 MG PO TABS: 15.0000 mg | ORAL_TABLET | Freq: Every day | ORAL | 0 refills | Status: DC

## 2020-05-06 NOTE — Patient Instructions (Signed)
Mediterranean Diet A Mediterranean diet refers to food and lifestyle choices that are based on the traditions of countries located on the [place]. This way of eating has been shown to help prevent certain conditions and improve outcomes for people who have chronic diseases, like kidney disease and heart disease. What are tips for following this plan? Lifestyle  Cook and eat meals together with your family, when possible.  Drink enough fluid to keep your urine clear or pale yellow.  Be physically active every day. This includes: ? Aerobic exercise like running or swimming. ? Leisure activities like gardening, walking, or housework.  Get 7-8 hours of sleep each night.  If recommended by your health care provider, drink red wine in moderation. This means 1 glass a day for nonpregnant women and 2 glasses a day for men. A glass of wine equals 5 oz (150 mL). Reading food labels  Check the serving size of packaged foods. For foods such as rice and pasta, the serving size refers to the amount of cooked product, not dry.  Check the total fat in packaged foods. Avoid foods that have saturated fat or trans fats.  Check the ingredients list for added sugars, such as corn syrup.   Shopping  At the grocery store, buy most of your food from the areas near the walls of the store. This includes: ? Fresh fruits and vegetables (produce). ? Grains, beans, nuts, and seeds. Some of these may be available in unpackaged forms or large amounts (in bulk). ? Fresh seafood. ? Poultry and eggs. ? Low-fat dairy products.  Buy whole ingredients instead of prepackaged foods.  Buy fresh fruits and vegetables in-season from local farmers markets.  Buy frozen fruits and vegetables in resealable bags.  If you do not have access to quality fresh seafood, buy precooked frozen shrimp or canned fish, such as tuna, salmon, or sardines.  Buy small amounts of raw or cooked vegetables, salads, or olives from  the deli or salad bar at your store.  Stock your pantry so you always have certain foods on hand, such as olive oil, canned tuna, canned tomatoes, rice, pasta, and beans. Cooking  Cook foods with extra-virgin olive oil instead of using butter or other vegetable oils.  Have meat as a side dish, and have vegetables or grains as your main dish. This means having meat in small portions or adding small amounts of meat to foods like pasta or stew.  Use beans or vegetables instead of meat in common dishes like chili or lasagna.  Experiment with different cooking methods. Try roasting or broiling vegetables instead of steaming or sauteing them.  Add frozen vegetables to soups, stews, pasta, or rice.  Add nuts or seeds for added healthy fat at each meal. You can add these to yogurt, salads, or vegetable dishes.  Marinate fish or vegetables using olive oil, lemon juice, garlic, and fresh herbs. Meal planning  Plan to eat 1 vegetarian meal one day each week. Try to work up to 2 vegetarian meals, if possible.  Eat seafood 2 or more times a week.  Have healthy snacks readily available, such as: ? Vegetable sticks with hummus. ? Greek yogurt. ? Fruit and nut trail mix.  Eat balanced meals throughout the week. This includes: ? Fruit: 2-3 servings a day ? Vegetables: 4-5 servings a day ? Low-fat dairy: 2 servings a day ? Fish, poultry, or lean meat: 1 serving a day ? Beans and legumes: 2 or more servings a week ?   Nuts and seeds: 1-2 servings a day ? Whole grains: 6-8 servings a day ? Extra-virgin olive oil: 3-4 servings a day  Limit red meat and sweets to only a few servings a month   What are my food choices?  Mediterranean diet ? Recommended  Grains: Whole-grain pasta. Brown rice. Bulgar wheat. Polenta. Couscous. Whole-wheat bread. Oatmeal. Quinoa.  Vegetables: Artichokes. Beets. Broccoli. Cabbage. Carrots. Eggplant. Green beans. Chard. Kale. Spinach. Onions. Leeks. Peas. Squash.  Tomatoes. Peppers. Radishes.  Fruits: Apples. Apricots. Avocado. Berries. Bananas. Cherries. Dates. Figs. Grapes. Lemons. Melon. Oranges. Peaches. Plums. Pomegranate.  Meats and other protein foods: Beans. Almonds. Sunflower seeds. Pine nuts. Peanuts. Cod. Salmon. Scallops. Shrimp. Tuna. Tilapia. Clams. Oysters. Eggs.  Dairy: Low-fat milk. Cheese. Greek yogurt.  Beverages: Water. Red wine. Herbal tea.  Fats and oils: Extra virgin olive oil. Avocado oil. Grape seed oil.  Sweets and desserts: Greek yogurt with honey. Baked apples. Poached pears. Trail mix.  Seasoning and other foods: Basil. Cilantro. Coriander. Cumin. Mint. Parsley. Sage. Rosemary. Tarragon. Garlic. Oregano. Thyme. Pepper. Balsalmic vinegar. Tahini. Hummus. Tomato sauce. Olives. Mushrooms. ? Limit these  Grains: Prepackaged pasta or rice dishes. Prepackaged cereal with added sugar.  Vegetables: Deep fried potatoes (french fries).  Fruits: Fruit canned in syrup.  Meats and other protein foods: Beef. Pork. Lamb. Poultry with skin. Hot dogs. Bacon.  Dairy: Ice cream. Sour cream. Whole milk.  Beverages: Juice. Sugar-sweetened soft drinks. Beer. Liquor and spirits.  Fats and oils: Butter. Canola oil. Vegetable oil. Beef fat (tallow). Lard.  Sweets and desserts: Cookies. Cakes. Pies. Candy.  Seasoning and other foods: Mayonnaise. Premade sauces and marinades. The items listed may not be a complete list. Talk with your dietitian about what dietary choices are right for you. Summary  The Mediterranean diet includes both food and lifestyle choices.  Eat a variety of fresh fruits and vegetables, beans, nuts, seeds, and whole grains.  Limit the amount of red meat and sweets that you eat.  Talk with your health care provider about whether it is safe for you to drink red wine in moderation. This means 1 glass a day for nonpregnant women and 2 glasses a day for men. A glass of wine equals 5 oz (150 mL). This information  is not intended to replace advice given to you by your health care provider. Make sure you discuss any questions you have with your health care provider. Document Revised: 01/15/2016 Document Reviewed: 01/08/2016 Elsevier Patient Education  2020 [employer]  

## 2020-05-06 NOTE — Progress Notes (Signed)
Date:  05/06/2020   Name:  Gerald Russell   DOB:  [birth year]   MRN:  [id number]   Chief Complaint: Annual Exam (thinks he has aggravated the muscle strain he was being treated for on the 15th of nov.)  Patient is a 29 year old male who presents for a comprehensive physical exam. The patient reports the following problems: groin discomfort. Health maintenance has been reviewed up to date.   Lab Results  Component Value Date   CREATININE 0.93 04/30/2019   BUN 16 04/30/2019   NA 140 04/30/2019   K 4.6 04/30/2019   CL 103 04/30/2019   CO2 21 04/30/2019   Lab Results  Component Value Date   CHOL 144 04/30/2019   HDL 50 04/30/2019   LDLCALC 79 04/30/2019   TRIG 78 04/30/2019   No results found for: TSH No results found for: HGBA1C Lab Results  Component Value Date   WBC 9.0 05/01/2018   HGB 16.1 05/01/2018   HCT 45.1 05/01/2018   MCV 88 05/01/2018   PLT 618 (H) 05/01/2018   Lab Results  Component Value Date   ALT 21 04/30/2019   AST 19 04/30/2019   ALKPHOS 95 04/30/2019   BILITOT 1.0 04/30/2019     Review of Systems  Constitutional: Negative for chills and fever.  HENT: Negative for drooling, ear discharge, ear pain and sore throat.   Respiratory: Negative for cough, chest tightness, shortness of breath and wheezing.   Cardiovascular: Negative for chest pain, palpitations and leg swelling.  Gastrointestinal: Negative for abdominal pain, blood in stool, constipation, diarrhea and nausea.  Endocrine: Negative for polydipsia.  Genitourinary: Negative for dysuria, frequency, hematuria and urgency.  Musculoskeletal: Negative for back pain, myalgias and neck pain.  Skin: Negative for rash.  Allergic/Immunologic: Negative for environmental allergies.  Neurological: Negative for dizziness and headaches.  Hematological: Does not bruise/bleed easily.  Psychiatric/Behavioral: Negative for suicidal ideas. The patient is not nervous/anxious.     There are no  problems to display for this patient.   Allergies  Allergen Reactions  . Hydrocodone     No past surgical history on file.  Social History   Tobacco Use  . Smoking status: Never Smoker  . Smokeless tobacco: Never Used  Vaping Use  . Vaping Use: Never used  Substance Use Topics  . Alcohol use: Not Currently    Alcohol/week: 0.0 standard drinks  . Drug use: Yes    Types: Marijuana     Medication list has been reviewed and updated.  No outpatient medications have been marked as taking for the 05/06/20 encounter (Office Visit) with Duanne Limerick, MD.    [facility] 2/9 Scores 04/14/2020 04/30/2019 01/31/2018 08/18/2015  PHQ - 2 Score 0 0 0 0  PHQ- 9 Score 0 0 0 -    GAD 7 : Generalized Anxiety Score 04/14/2020 04/30/2019  Nervous, Anxious, on Edge 0 1  Control/stop worrying 0 0  Worry too much - different things 0 0  Trouble relaxing 0 0  Restless 0 0  Easily annoyed or irritable 0 0  Afraid - awful might happen 0 0  Total GAD 7 Score 0 1    BP Readings from Last 3 Encounters:  05/06/20 102/62  04/14/20 118/88  04/30/19 120/80    Physical Exam Vitals and nursing note reviewed. Exam conducted with a chaperone present.  Constitutional:      Appearance: Normal appearance. He is well-groomed and overweight.  HENT:  Head: Normocephalic.     Jaw: There is normal jaw occlusion.     Right Ear: Hearing, tympanic membrane, ear canal and external ear normal.     Left Ear: Hearing, tympanic membrane, ear canal and external ear normal.     Nose: Nose normal.     Right Turbinates: Not enlarged.     Left Turbinates: Not enlarged.     Mouth/Throat:     Lips: Pink.     Mouth: Mucous membranes are moist. Mucous membranes are pale.     Dentition: Normal dentition.     Tongue: No lesions.     Palate: No mass.     Pharynx: Oropharynx is clear. Uvula midline.     Tonsils: No tonsillar exudate.  Eyes:     General: Lids are normal. Vision grossly intact. Gaze aligned  appropriately. No scleral icterus.       Right eye: No discharge.        Left eye: No discharge.     Conjunctiva/sclera: Conjunctivae normal.     Pupils: Pupils are equal, round, and reactive to light.     Funduscopic exam:    Right eye: Red reflex present.        Left eye: Red reflex present. Neck:     Thyroid: No thyroid mass, thyromegaly or thyroid tenderness.     Vascular: Normal carotid pulses. No carotid bruit, hepatojugular reflux or JVD.     Trachea: Trachea and phonation normal. No tracheal deviation.  Cardiovascular:     Rate and Rhythm: Normal rate and regular rhythm.     Chest Wall: PMI is not displaced.     Pulses: Normal pulses.          Carotid pulses are 2+ on the right side and 2+ on the left side.      Radial pulses are 2+ on the right side and 2+ on the left side.       Femoral pulses are 2+ on the right side and 2+ on the left side.      Popliteal pulses are 2+ on the right side and 2+ on the left side.       Dorsalis pedis pulses are 2+ on the right side and 2+ on the left side.       Posterior tibial pulses are 2+ on the right side and 2+ on the left side.     Heart sounds: Normal heart sounds, S1 normal and S2 normal. No murmur heard.  No systolic murmur is present.  No diastolic murmur is present.  No friction rub. No gallop. No S3 or S4 sounds.   Pulmonary:     Effort: No respiratory distress.     Breath sounds: Normal breath sounds and air entry. No decreased breath sounds, wheezing, rhonchi or rales.  Chest:     Breasts:        Right: Normal.        Left: Normal.  Abdominal:     General: Bowel sounds are normal.     Palpations: Abdomen is soft. There is no hepatomegaly, splenomegaly or mass.     Tenderness: There is no abdominal tenderness. There is no right CVA tenderness, left CVA tenderness, guarding or rebound.     Hernia: A hernia is present. Hernia is present in the left inguinal area and right inguinal area. There is no hernia in the umbilical  area or ventral area.     Comments: Early proximal tenderness L>R/left symptomatic  Genitourinary:  Penis: Normal and circumcised.      Testes: Normal.        Right: Mass, tenderness or swelling not present.        Left: Mass, tenderness or swelling not present.     Epididymis:     Right: Normal.     Left: Normal.  Musculoskeletal:        General: Normal range of motion.     Cervical back: Full passive range of motion without pain, normal range of motion and neck supple.     Right upper leg: Normal. No tenderness.     Left upper leg: No tenderness.     Right lower leg: No edema.     Left lower leg: No edema.  Lymphadenopathy:     Head:     Right side of head: No submandibular or tonsillar adenopathy.     Left side of head: No submandibular or tonsillar adenopathy.     Cervical: No cervical adenopathy.     Right cervical: No superficial, deep or posterior cervical adenopathy.    Left cervical: No superficial, deep or posterior cervical adenopathy.     Upper Body:     Right upper body: No supraclavicular or axillary adenopathy.     Left upper body: No supraclavicular or axillary adenopathy.     Lower Body: No right inguinal adenopathy. No left inguinal adenopathy.  Skin:    General: Skin is warm.     Findings: No rash.  Neurological:     Mental Status: He is alert and oriented to person, place, and time.     Cranial Nerves: Cranial nerves are intact. No cranial nerve deficit.     Sensory: Sensation is intact.     Motor: Motor function is intact.     Deep Tendon Reflexes: Reflexes are normal and symmetric.     Reflex Scores:      Tricep reflexes are 2+ on the right side and 2+ on the left side.      Bicep reflexes are 2+ on the right side and 2+ on the left side.      Brachioradialis reflexes are 2+ on the right side and 2+ on the left side.      Patellar reflexes are 2+ on the right side and 2+ on the left side.      Achilles reflexes are 2+ on the right side and 2+ on the  left side. Psychiatric:        Behavior: Behavior is cooperative.     Wt Readings from Last 3 Encounters:  05/06/20 232 lb (105.2 kg)  04/14/20 232 lb (105.2 kg)  04/30/19 254 lb (115.2 kg)    BP 102/62   Pulse 76   Ht 6\' 2"  (1.88 m)   Wt 232 lb (105.2 kg)   BMI 29.79 kg/m   Assessment and Plan: 1. Annual physical exam Patient's chart was reviewed for previous encounters as well as most recent labs, most recent imaging, and any care everywhere.Dorrance Sellick is a 29 y.o. male who presents today for his Complete Annual Exam. He feels well. He reports exercising . He reports he is sleeping well.Immunizations are reviewed and recommendations provided.   Age appropriate screening tests are discussed. Counseling given for risk factor reduction interventions.  We will check a lipid panel with CMP for current levels. - Lipid Panel With LDL/HDL Ratio - Comprehensive metabolic panel  2. Non-recurrent bilateral inguinal hernia without obstruction or gangrene New onset.  Persistent.  Stable.  Patient was doing some better but no resolution while on meloxicam.  Patient became more active and ran 4 miles and did some significant lifting and this resulted in more discomfort in the previous noted area.  Examination of the testicles and epididymis are unremarkable with no tenderness and no abnormalities.  There is no significant inguinal hernia noted on exam but and palpation over the inguinal areas area some discomfort and some sensation of the bulge noted bilateral in the inguinal areas bilateral.  I think this may be an early inguinal hernia but the symptomatology is starting to affect his ability to exercise.  We will continue the meloxicam through the end of the year and call in report as to status and if continued symptomatology we will refer to general surgery for evaluation. - meloxicam (MOBIC) 15 MG tablet; Take 1 tablet (15 mg total) by mouth daily.  Dispense: 30 tablet; Refill:  0

## 2020-05-07 LAB — COMPREHENSIVE METABOLIC PANEL
ALT: 17 IU/L (ref 0–44)
AST: 13 IU/L (ref 0–40)
Albumin/Globulin Ratio: 1.7 (ref 1.2–2.2)
Albumin: 4.8 g/dL (ref 4.1–5.2)
Alkaline Phosphatase: 77 IU/L (ref 44–121)
BUN/Creatinine Ratio: 18 (ref 9–20)
BUN: 16 mg/dL (ref 6–20)
Bilirubin Total: 1.3 mg/dL — ABNORMAL HIGH (ref 0.0–1.2)
CO2: 22 mmol/L (ref 20–29)
Calcium: 10.2 mg/dL (ref 8.7–10.2)
Chloride: 103 mmol/L (ref 96–106)
Creatinine, Ser: 0.88 mg/dL (ref 0.76–1.27)
GFR calc Af Amer: 136 mL/min/{1.73_m2} (ref >59)
GFR calc non Af Amer: 118 mL/min/{1.73_m2} (ref >59)
Globulin, Total: 2.9 g/dL (ref 1.5–4.5)
Glucose: 99 mg/dL (ref 65–99)
Potassium: 4.4 mmol/L (ref 3.5–5.2)
Sodium: 140 mmol/L (ref 134–144)
Total Protein: 7.7 g/dL (ref 6.0–8.5)

## 2020-05-07 LAB — LIPID PANEL WITH LDL/HDL RATIO
Cholesterol, Total: 220 mg/dL — ABNORMAL HIGH (ref 100–199)
HDL: 43 mg/dL (ref >39)
LDL Chol Calc (NIH): 150 mg/dL — ABNORMAL HIGH (ref 0–99)
LDL/HDL Ratio: 3.5 ratio (ref 0.0–3.6)
Triglycerides: 147 mg/dL (ref 0–149)
VLDL Cholesterol Cal: 27 mg/dL (ref 5–40)

## 2020-06-05 ENCOUNTER — Other Ambulatory Visit: Payer: Self-pay | Admitting: Family Medicine

## 2020-06-05 ENCOUNTER — Telehealth: Payer: Self-pay

## 2020-06-05 ENCOUNTER — Other Ambulatory Visit: Payer: Self-pay

## 2020-06-05 DIAGNOSIS — K4020 Bilateral inguinal hernia, without obstruction or gangrene, not specified as recurrent: Secondary | ICD-10-CM

## 2020-06-05 NOTE — Telephone Encounter (Signed)
Copied from CRM [id number]. Topic: General - Other >> Jun 05, 2020  9:59 AM Jaquita Rector A wrote: Reason for CRM: Patient called in to speak to Dr Yetta Barre say that he was told by Dr Yetta Barre to call in the new year to discuss what the next step will be with his hernia. Patient can be reached at Ph# [phone]

## 2020-06-05 NOTE — Telephone Encounter (Signed)
Please call and ask him if he wants a referral to general surgery. That would be the next step for the hernia if it is hurting him

## 2020-06-05 NOTE — Progress Notes (Unsigned)
Ref to surgery put in

## 2020-06-05 NOTE — Telephone Encounter (Signed)
I put it in

## 2020-06-12 ENCOUNTER — Telehealth: Payer: Self-pay

## 2020-06-12 ENCOUNTER — Other Ambulatory Visit: Payer: Self-pay

## 2020-06-12 ENCOUNTER — Ambulatory Visit (INDEPENDENT_AMBULATORY_CARE_PROVIDER_SITE_OTHER): Payer: 59 | Admitting: General Surgery

## 2020-06-12 ENCOUNTER — Encounter: Payer: Self-pay | Admitting: General Surgery

## 2020-06-12 VITALS — BP 143/87 | HR 67 | Temp 98.2°F | Ht 74.0 in | Wt 241.8 lb

## 2020-06-12 DIAGNOSIS — K4020 Bilateral inguinal hernia, without obstruction or gangrene, not specified as recurrent: Secondary | ICD-10-CM | POA: Diagnosis not present

## 2020-06-12 NOTE — Progress Notes (Signed)
Patient ID: Gerald Russell, male   DOB: 1990-06-23, 30 y.o.   MRN: [id number]  Chief Complaint  Patient presents with  . New Patient (Initial Visit)    Bilateral inguinal hernia    HPI Gerald Russell is a 30 y.o. male.   He has been referred by his primary care provider, Dr. Elizabeth Sauer, for evaluation of possible bilateral inguinal hernias.  He reports that around the middle of November, he had discomfort in his left groin.  He saw his primary care provider who diagnosed him with an iliopsoas muscle strain and initiated meloxicam.  He continued to have discomfort and was seen again in the middle of December at which time, Dr. Yetta Barre did not appreciate hernias on exam, but thought there was possible early bulging in the inguinal canals.  Due to his active lifestyle and the fact that this discomfort was affecting his ability to exercise, he was referred for further evaluation and management.  He denies any nausea or vomiting.  He says that the pain seems to be worse with exertion and is primarily located in the bilateral groin area but there is also some lower abdominal discomfort.  It does not radiate.  He is having normal bowel movements and denies constipation.   Past Medical History:  Diagnosis Date  . Obesity     Past Surgical History:  Procedure Laterality Date  . PILONIDAL CYST EXCISION Left 2015  . REPAIR CRANIAL DEFECT SIMPLE     management of cranial synostosis (as an infant)    Family History  Problem Relation Age of Onset  . Cancer Father     Social History Social History   Tobacco Use  . Smoking status: Never Smoker  . Smokeless tobacco: Never Used  Vaping Use  . Vaping Use: Never used  Substance Use Topics  . Alcohol use: Not Currently    Alcohol/week: 0.0 standard drinks  . Drug use: Yes    Types: Marijuana    Allergies  Allergen Reactions  . Hydrocodone     Current Outpatient Medications  Medication Sig Dispense Refill  . meloxicam  (MOBIC) 15 MG tablet TAKE 1 TABLET(15 MG) BY MOUTH DAILY 30 tablet 0   No current facility-administered medications for this visit.    Review of Systems Review of Systems  All other systems reviewed and are negative. Or as discussed in the history of present illness Blood pressure (!) 143/87, pulse 67, temperature 98.2 F (36.8 C), temperature source Oral, height 6\' 2"  (1.88 m), weight 241 lb 12.8 oz (109.7 kg), SpO2 98 %.  Physical Exam Physical Exam Vitals reviewed. Exam conducted with a chaperone present.  Constitutional:      General: He is not in acute distress.    Appearance: Normal appearance.  HENT:     Head: Normocephalic and atraumatic.     Nose:     Comments: Covered with a mask    Mouth/Throat:     Comments: Covered with a mask Eyes:     General: No scleral icterus.       Right eye: No discharge.        Left eye: No discharge.     Conjunctiva/sclera: Conjunctivae normal.  Neck:     Comments: No palpable cervical or supraclavicular lymphadenopathy.  The trachea is midline.  There are no palpable thyroid masses or thyromegaly present. Cardiovascular:     Rate and Rhythm: Normal rate and regular rhythm.     Pulses: Normal pulses.  Pulmonary:  Effort: Pulmonary effort is normal.     Breath sounds: Normal breath sounds.  Abdominal:     General: Bowel sounds are normal.     Palpations: Abdomen is soft.  Genitourinary:    Comments: No hernia or bulge appreciated in either inguinal canal. Musculoskeletal:        General: No swelling. Normal range of motion.  Skin:    General: Skin is warm and dry.  Neurological:     General: No focal deficit present.     Mental Status: He is alert and oriented to person, place, and time.  Psychiatric:        Mood and Affect: Mood normal.        Behavior: Behavior normal.     Data Reviewed I reviewed his primary care provider's notes from November 15 and May 06, 2020 wherein she was trying to discern the etiology of  his discomfort and resulted in his referral to general surgery.  Results for [name], [name] (MRN [id number]) as of 06/12/2020 09:27  Ref. Range 05/06/2020 10:28  Sodium Latest Ref Range: 134 - 144 mmol/L 140  Potassium Latest Ref Range: 3.5 - 5.2 mmol/L 4.4  Chloride Latest Ref Range: 96 - 106 mmol/L 103  CO2 Latest Ref Range: 20 - 29 mmol/L 22  Glucose Latest Ref Range: 65 - 99 mg/dL 99  BUN Latest Ref Range: 6 - 20 mg/dL 16  Creatinine Latest Ref Range: 0.76 - 1.27 mg/dL 4.78  Calcium Latest Ref Range: 8.7 - 10.2 mg/dL 29.5  BUN/Creatinine Ratio Latest Ref Range: 9 - 20  18  Alkaline Phosphatase Latest Ref Range: 44 - 121 IU/L 77  Albumin Latest Ref Range: 4.1 - 5.2 g/dL 4.8  Albumin/Globulin Ratio Latest Ref Range: 1.2 - 2.2  1.7  AST Latest Ref Range: 0 - 40 IU/L 13  ALT Latest Ref Range: 0 - 44 IU/L 17  Total Protein Latest Ref Range: 6.0 - 8.5 g/dL 7.7  Total Bilirubin Latest Ref Range: 0.0 - 1.2 mg/dL 1.3 (H)  GFR, Est Non African American Latest Ref Range: >59 mL/min/1.73 118  GFR, Est African American Latest Ref Range: >59 mL/min/1.73 136  Cholesterol, Total Latest Ref Range: 100 - 199 mg/dL 621 (H)  HDL Cholesterol Latest Ref Range: >39 mg/dL 43  LDL/HDL Ratio Latest Ref Range: 0.0 - 3.6 ratio 3.5  Triglycerides Latest Ref Range: 0 - 149 mg/dL 308  VLDL Cholesterol Cal Latest Ref Range: 5 - 40 mg/dL 27  LDL Chol Calc (NIH) Latest Ref Range: 0 - 99 mg/dL 657 (H)  Globulin, Total Latest Ref Range: 1.5 - 4.5 g/dL 2.9  Aside from some hypercholesterolemia and a very mild elevation in his bilirubin, these labs are within normal range.  Assessment This is a 30 year old man here to has lost roughly 100 pounds over the last year.  He has done this by increasing his physical activity level.  In November, he noticed some inguinal discomfort and was initially diagnosed with an iliopsoas drainage.  His discomfort has persisted and the question of whether or not he may have  bilateral inguinal hernias was raised.  I am unable to appreciate any defect on exam today.  Plan We will order a limited pelvic ultrasound to assess for possible inguinal hernias.  If this is positive, we will proceed with robotic inguinal hernia repair.I have explained the procedure, risks, and aftercare of inguinal hernia repair to Gerald Russell.   Risks include but are not limited to bleeding, infection,  wound problems, anesthesia, recurrence, bladder or intestine injury, urinary retention, testicular dysfunction, chronic pain, mesh problems.  If the study is negative, he may benefit from referral to sports medicine practitioner for further evaluation and management.  Once I have the results of his ultrasound, I will contact him and proceed from there.Gerald Russell 06/12/2020, 9:19 AM

## 2020-06-12 NOTE — Telephone Encounter (Signed)
US pelvis limited is scheduled for 06/17/2020 @ 4pm. Pt is to come with a full bladder and drink 32 oz of water an hour before appointment.  Pt notified. Verbalizes understanding.

## 2020-06-12 NOTE — Patient Instructions (Addendum)
An Ultrasound order has been placed and I will call you for the appointment. We will call you with the results. If you have any concerns or questions, please feel free to call our office.    Inguinal Hernia, Adult An inguinal hernia is when fat or your intestines push through a weak spot in a muscle where your leg meets your lower belly (groin). This causes a bulge. This kind of hernia could also be:  In your scrotum, if you are male.  In folds of skin around your vagina, if you are male. There are three types of inguinal hernias:  Hernias that can be pushed back into the belly (are reducible). This type rarely causes pain.  Hernias that cannot be pushed back into the belly (are incarcerated).  Hernias that cannot be pushed back into the belly and lose their blood supply (are strangulated). This type needs emergency surgery. What are the causes? This condition is caused by having a weak spot in the muscles or tissues in your groin. This develops over time. The hernia may poke through the weak spot when you strain your lower belly muscles all of a sudden, such as when you:  Lift a heavy object.  Strain to poop (have a bowel movement). Trouble pooping (constipation) can lead to straining.  Cough. What increases the risk? This condition is more likely to develop in:  Males.  Pregnant females.  People who: ? Are overweight. ? Work in jobs that require long periods of standing or heavy lifting. ? Have had an inguinal hernia before. ? Smoke or have lung disease. These factors can lead to long-term (chronic) coughing. What are the signs or symptoms? Symptoms may depend on the size of the hernia. Often, a small hernia has no symptoms. Symptoms of a larger hernia may include:  A bulge in the groin area. This is easier to see when standing. You might not be able to see it when you are lying down.  Pain or burning in the groin. This may get worse when you lift, strain, or cough.  A  dull ache or a feeling of pressure in the groin.  An abnormal bulge in the scrotum, in males. Symptoms of a strangulated inguinal hernia may include:  A bulge in your groin that is very painful and tender to the touch.  A bulge that turns red or purple.  Fever, feeling like you may vomit (nausea), and vomiting.  Not being able to poop or to pass gas. How is this treated? Treatment depends on the size of your hernia and whether you have symptoms. If you do not have symptoms, your doctor may have you watch your hernia carefully and have you come in for follow-up visits. If your hernia is large or if you have symptoms, you may need surgery to repair the hernia. Follow these instructions at home: Lifestyle  Avoid lifting heavy objects.  Avoid standing for long amounts of time.  Do not smoke or use any products that contain nicotine or tobacco. If you need help quitting, ask your doctor.  Stay at a healthy weight. Prevent trouble pooping You may need to take these actions to prevent or treat trouble pooping:  Drink enough fluid to keep your pee (urine) pale yellow.  Take over-the-counter or prescription medicines.  Eat foods that are high in fiber. These include beans, whole grains, and fresh fruits and vegetables.  Limit foods that are high in fat and sugar. These include fried or sweet foods. General  instructions  You may try to push your hernia back in place by very gently pressing on it when you are lying down. Do not try to push the bulge back in if it will not go in easily.  Watch your hernia for any changes in shape, size, or color. Tell your doctor if you see any changes.  Take over-the-counter and prescription medicines only as told by your doctor.  Keep all follow-up visits. Contact a doctor if:  You have a fever or chills.  You have new symptoms.  Your symptoms get worse. Get help right away if:  You have pain in your groin that gets worse all of a  sudden.  You have a bulge in your groin that: ? Gets bigger all of a sudden, and it does not get smaller after that. ? Turns red or purple. ? Is painful when you touch it.  You are a male, and you have: ? Sudden pain in your scrotum. ? A sudden change in the size of your scrotum.  You cannot push the hernia back in place by very gently pressing on it when you are lying down.  You feel like you may vomit, and that feeling does not go away.  You keep vomiting.  You have a fast heartbeat.  You cannot poop or pass gas. These symptoms may be an emergency. Get help right away. Call your local emergency services (911 in the [place]  Do not wait to see if the symptoms will go away.  Do not drive yourself to the hospital. Summary  An inguinal hernia is when fat or your intestines push through a weak spot in a muscle where your leg meets your lower belly (groin). This causes a bulge.  If you do not have symptoms, you may not need treatment. If you have symptoms or a large hernia, you may need surgery.  Avoid lifting heavy objects. Also, avoid standing for long amounts of time.  Do not try to push the bulge back in if it will not go in easily. This information is not intended to replace advice given to you by your health care provider. Make sure you discuss any questions you have with your health care provider. Document Revised: 01/15/2020 Document Reviewed: 01/15/2020 Elsevier Patient Education  2021 [employer].

## 2020-06-17 ENCOUNTER — Other Ambulatory Visit: Payer: Self-pay

## 2020-06-17 ENCOUNTER — Other Ambulatory Visit: Payer: Self-pay | Admitting: General Surgery

## 2020-06-17 ENCOUNTER — Ambulatory Visit
Admission: RE | Admit: 2020-06-17 | Discharge: 2020-06-17 | Disposition: A | Discharge status: Home / Self Care | Payer: 59 | Source: Ambulatory Visit | Attending: General Surgery | Admitting: General Surgery

## 2020-06-17 DIAGNOSIS — K4020 Bilateral inguinal hernia, without obstruction or gangrene, not specified as recurrent: Secondary | ICD-10-CM | POA: Insufficient documentation

## 2020-06-17 DIAGNOSIS — R1031 Right lower quadrant pain: Secondary | ICD-10-CM | POA: Diagnosis present

## 2020-06-17 DIAGNOSIS — R1032 Left lower quadrant pain: Secondary | ICD-10-CM | POA: Diagnosis present

## 2020-06-19 ENCOUNTER — Telehealth: Payer: Self-pay | Admitting: General Surgery

## 2020-06-19 NOTE — Telephone Encounter (Signed)
I discussed the results of the ultrasound with Gerald Russell.  No hernia was appreciated on either side.  I did offer him a referral to sports medicine, but he declined at this time.

## 2020-07-04 ENCOUNTER — Other Ambulatory Visit: Payer: Self-pay | Admitting: Family Medicine

## 2020-07-04 DIAGNOSIS — K4020 Bilateral inguinal hernia, without obstruction or gangrene, not specified as recurrent: Secondary | ICD-10-CM

## 2020-07-04 NOTE — Telephone Encounter (Signed)
Requested medications are due for refill today yes  Requested medications are on the active medication list yes  Last refill 1/7  Last visit 04/2020  Future visit scheduled no  Notes to clinic Unclear if this was to be continued.

## 2021-02-17 ENCOUNTER — Encounter: Payer: Self-pay | Admitting: General Surgery

## 2021-08-07 IMAGING — US US EXTREM LOW*L* LIMITED
1 series · 14 of 25 positions shown · non-contrast
Comparison: None.

CLINICAL DATA: Inguinal hernia evaluation.

EXAM:
ULTRASOUND bilateral LOWER EXTREMITY LIMITED
TECHNIQUE: Ultrasound examination of the lower extremity soft tissues was
performed in the area of clinical concern.

[Series 1: us left lower extrem ltd soft tissue non vascular · 14 of 26 slices shown]
[im 1/26]
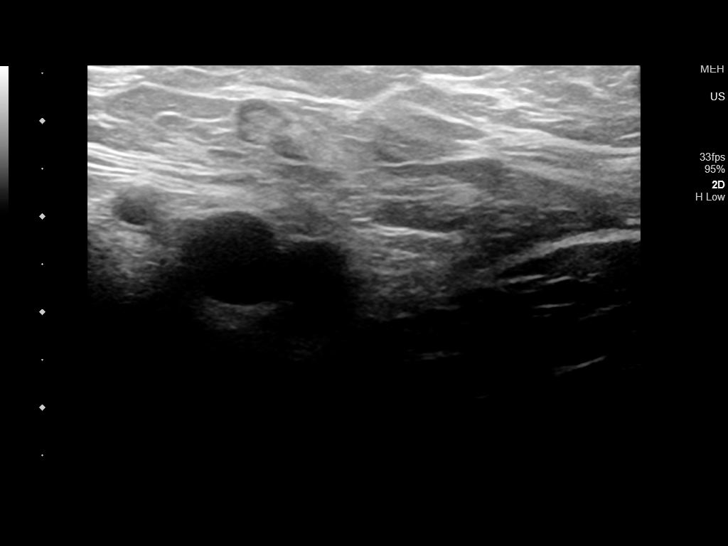
[im 3/26]
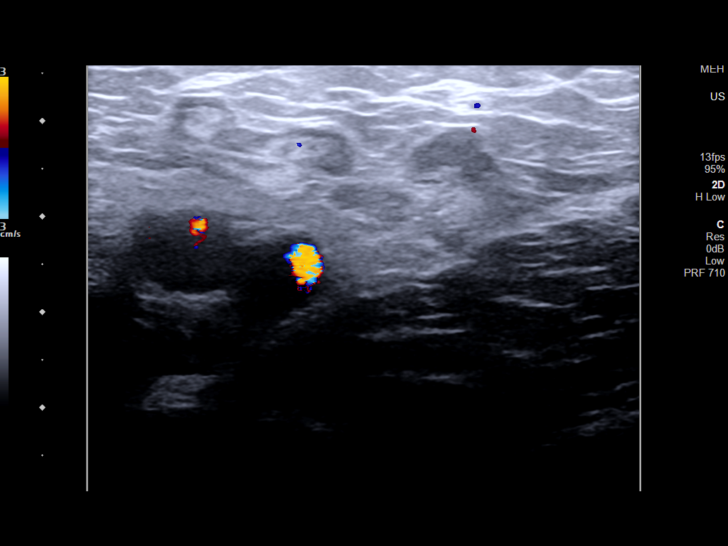
[im 5/26]
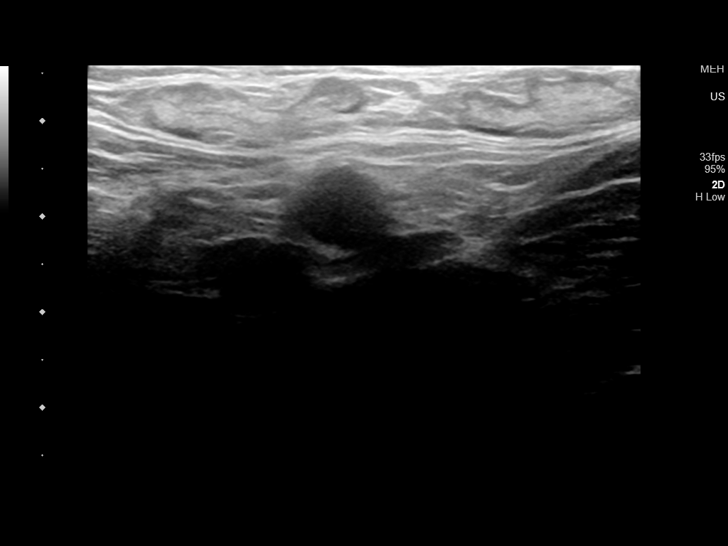
[im 7/26]
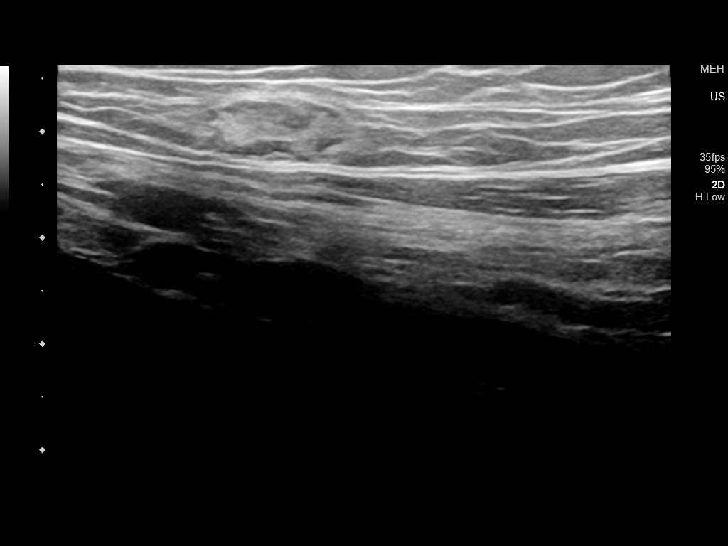
[im 9/26]
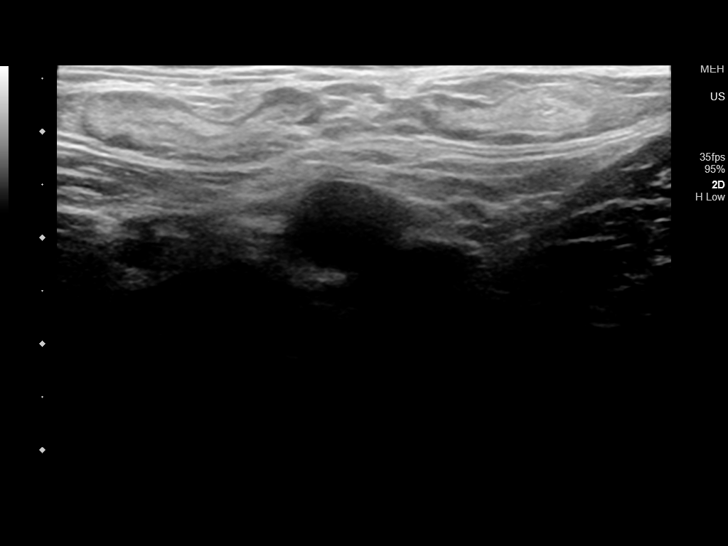
[im 10/26]
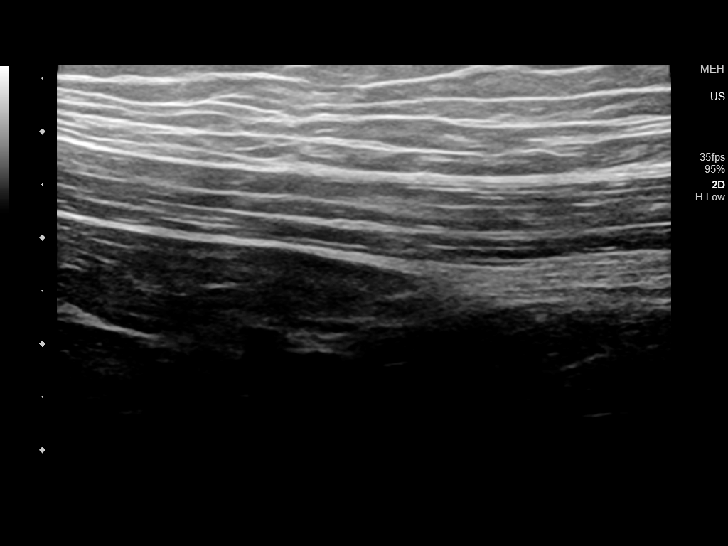
[im 12/26]
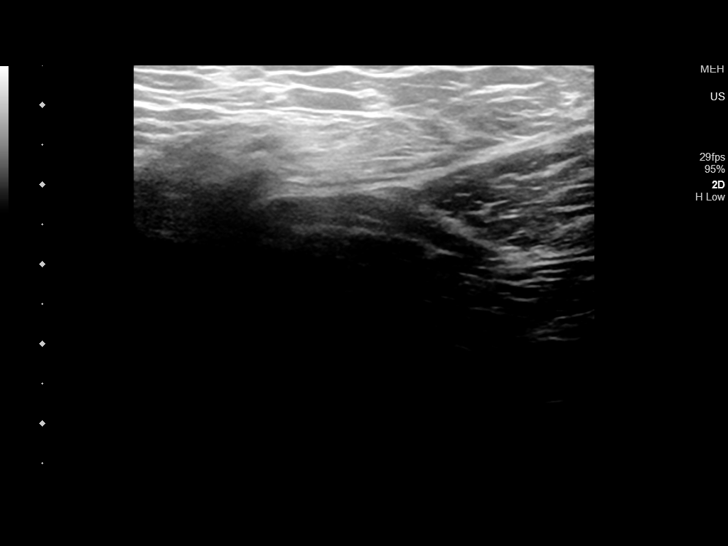
[im 14/26]
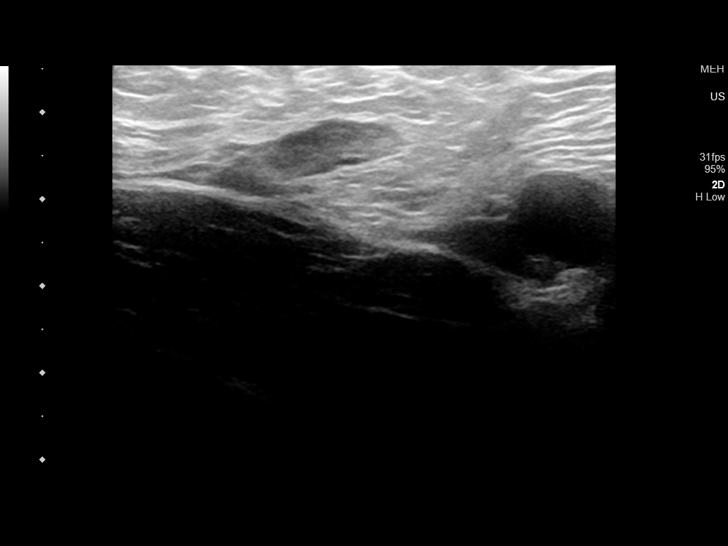
[im 16/26]
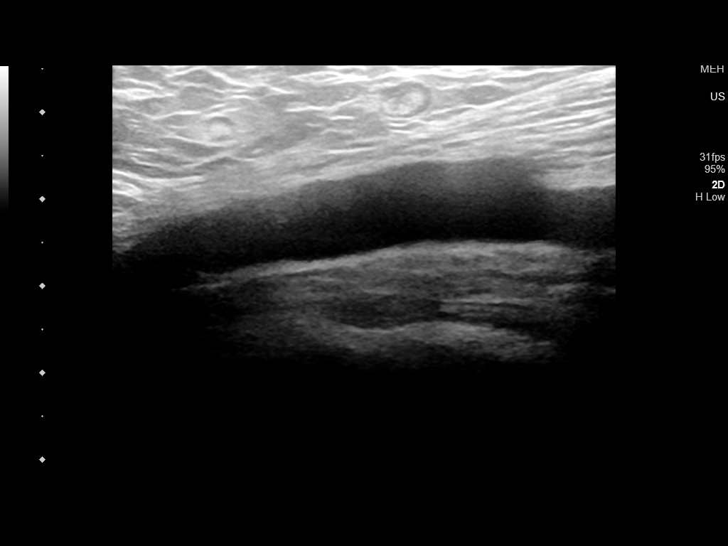
[im 17/26]
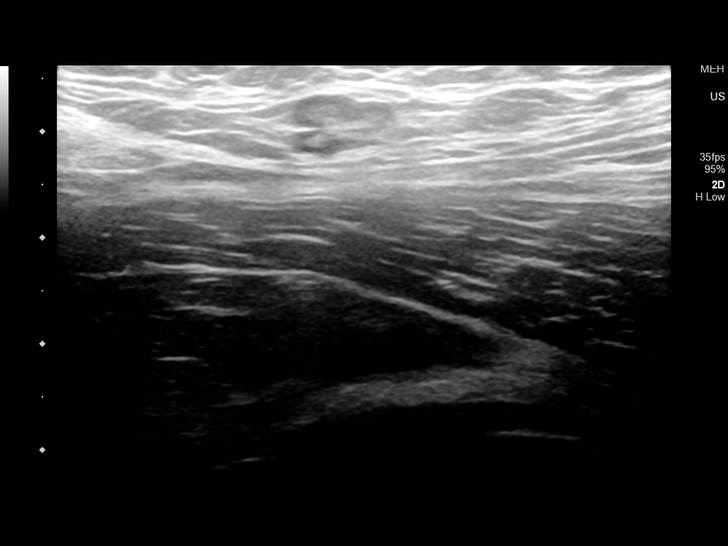
[im 19/26]
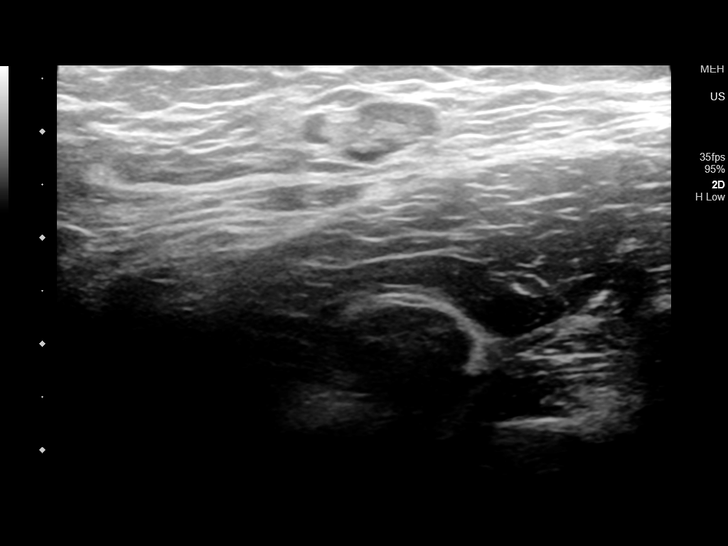
[im 21/26]
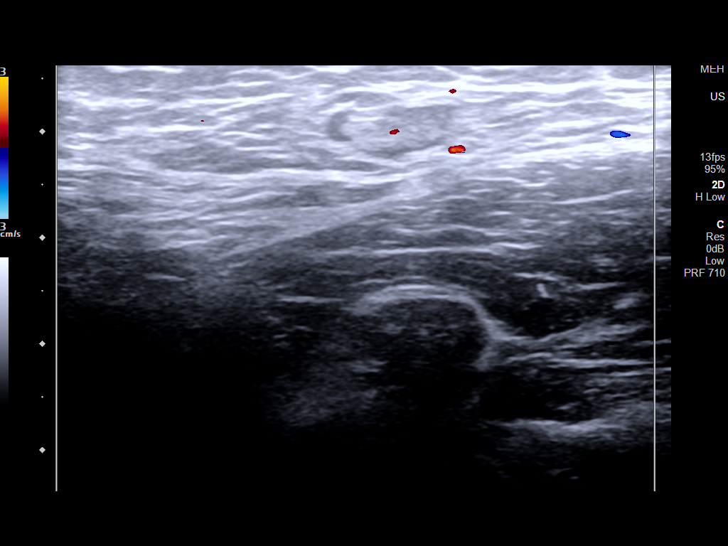
[im 23/26]
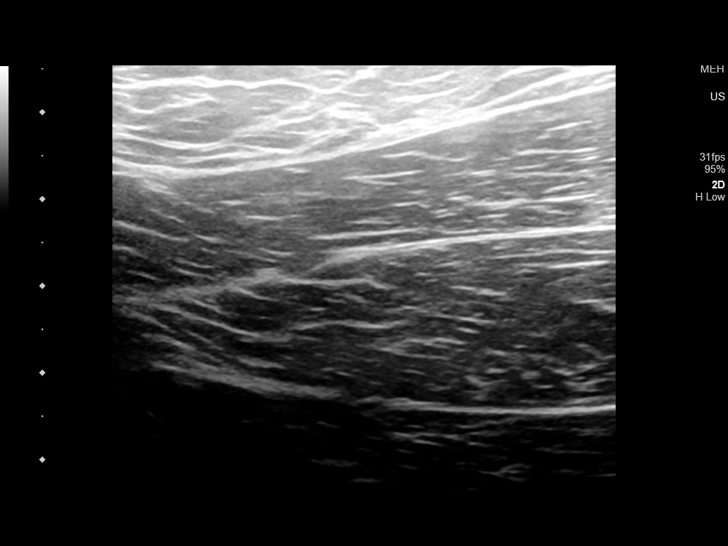
[im 26/26]
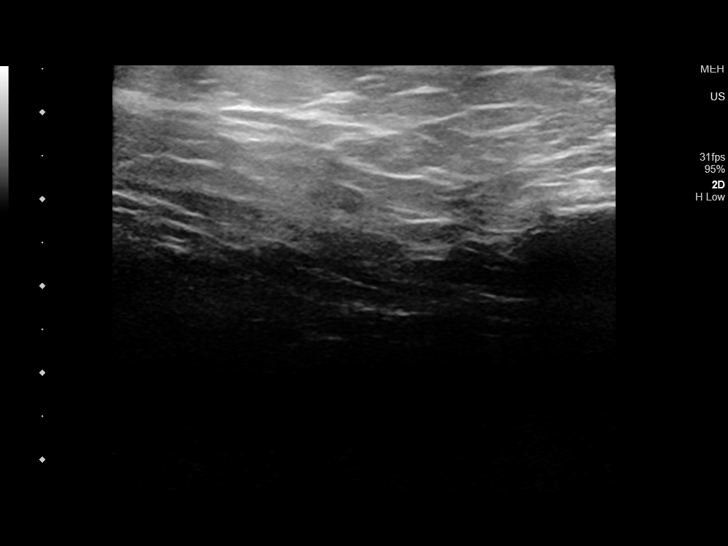

[14 of 25 positions shown; findings below may reference images not displayed]

FINDINGS: Evaluation of both the the right left groin did not demonstrate
sonographic evidence for an inguinal hernia. Multiple
morphologically normal appearing inguinal lymph nodes are noted.
These are of doubtful clinical significance. There was no mass or
fluid collection.
IMPRESSION: No inguinal hernia identified bilaterally.

## 2021-08-07 IMAGING — US US EXTREM LOW*R* LIMITED
1 series · 14 of 25 positions shown · non-contrast
Comparison: None.

CLINICAL DATA: Inguinal hernia evaluation.

EXAM:
ULTRASOUND bilateral LOWER EXTREMITY LIMITED
TECHNIQUE: Ultrasound examination of the lower extremity soft tissues was
performed in the area of clinical concern.

[Series 1: us left lower extrem ltd soft tissue non vascular · 14 of 26 slices shown]
[im 1/26]
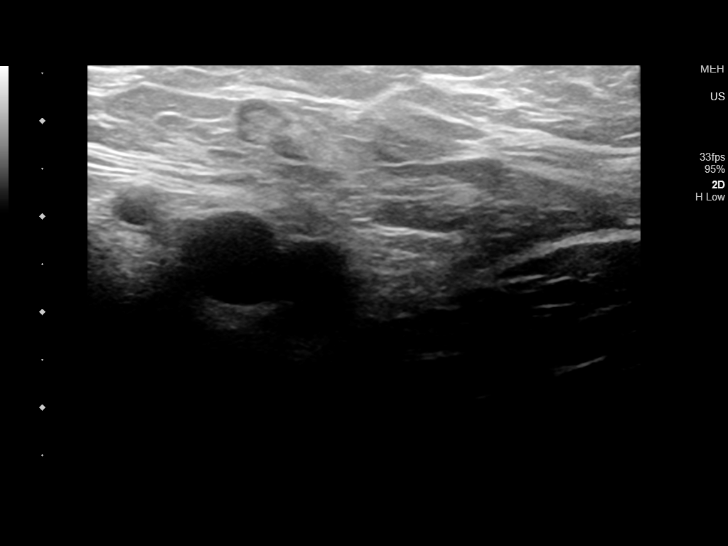
[im 3/26]
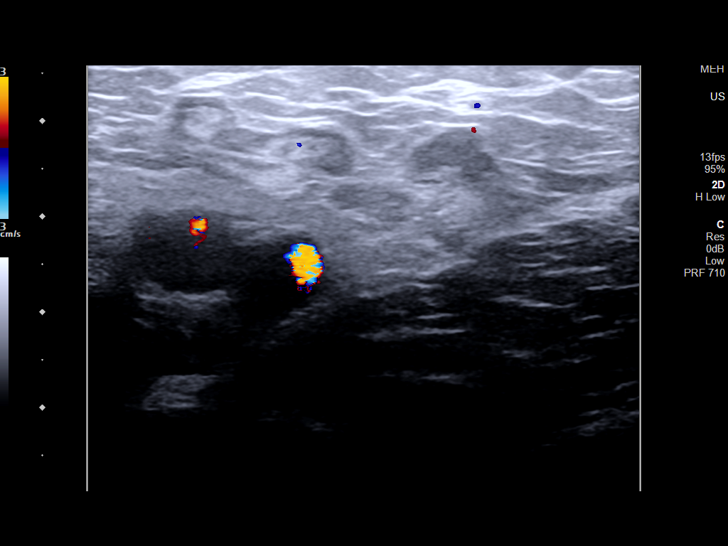
[im 5/26]
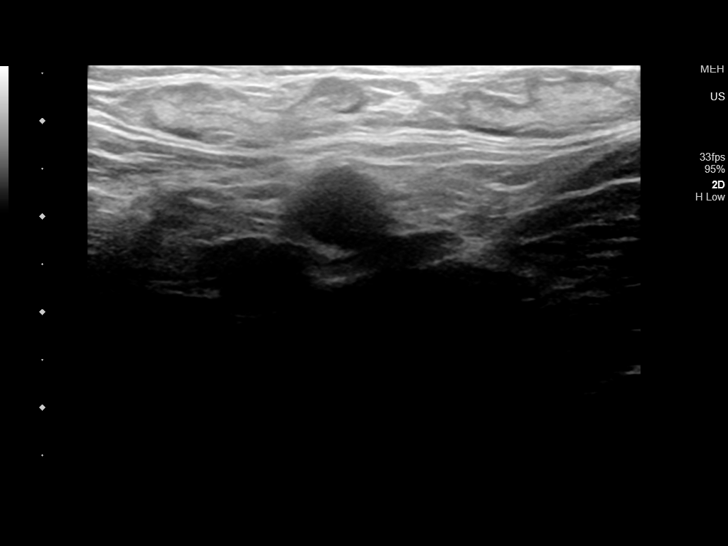
[im 7/26]
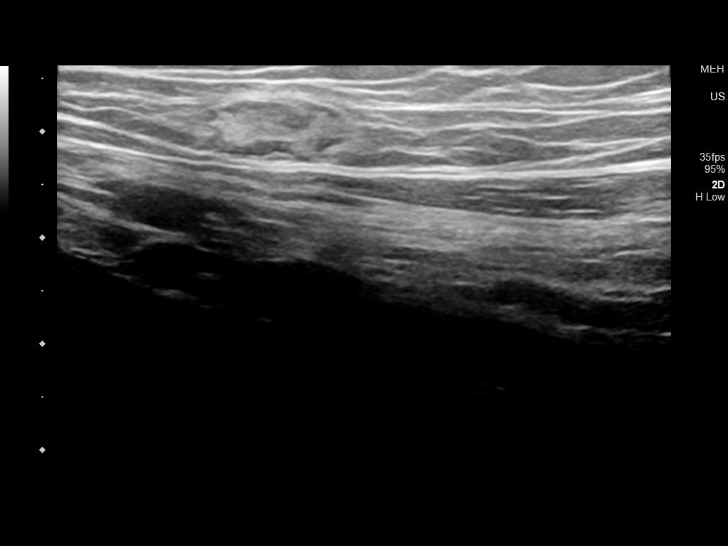
[im 9/26]
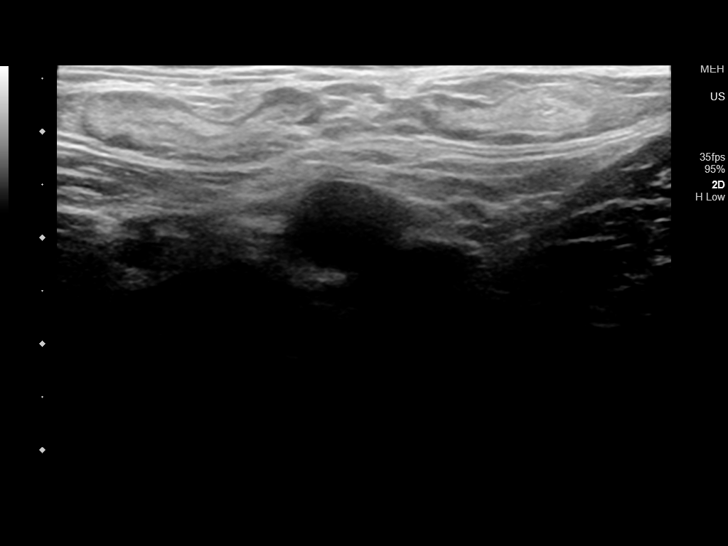
[im 10/26]
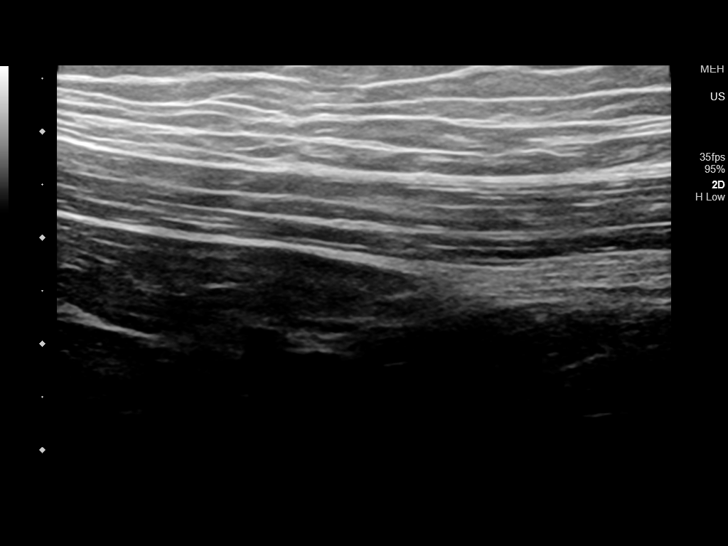
[im 12/26]
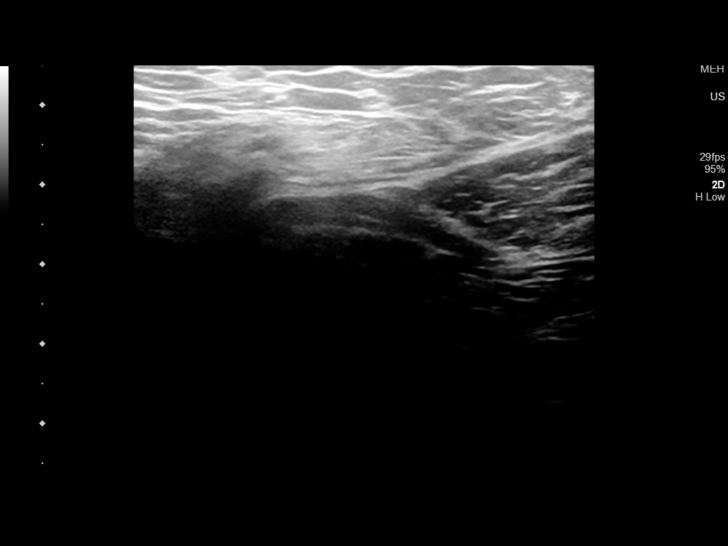
[im 14/26]
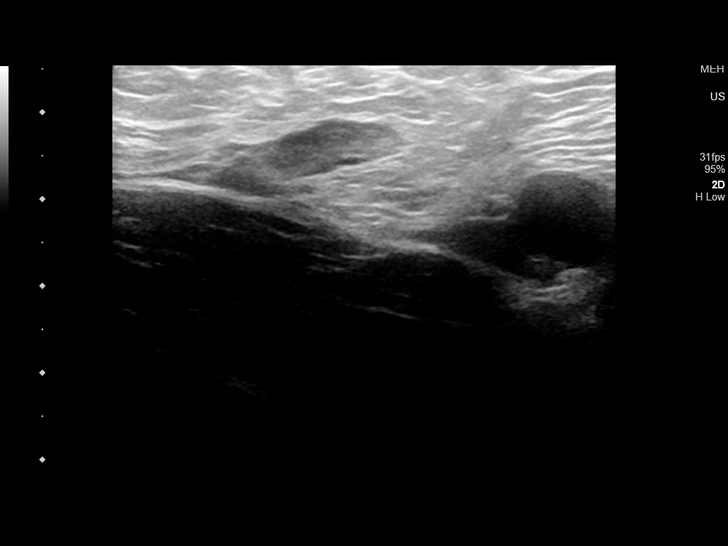
[im 16/26]
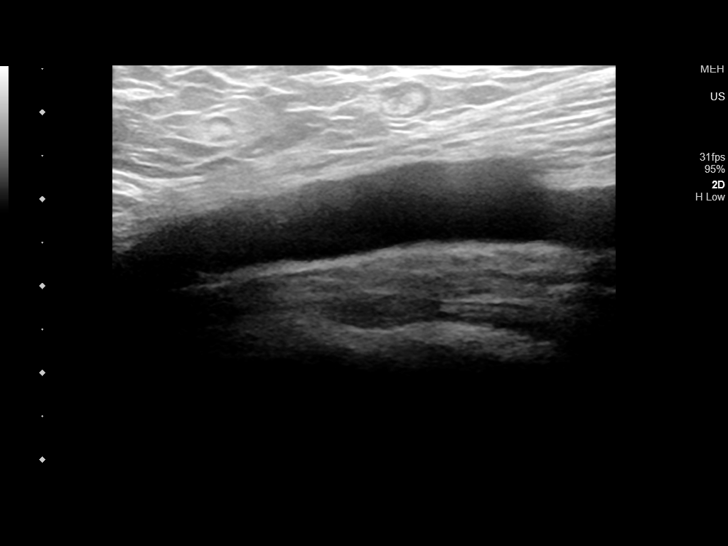
[im 17/26]
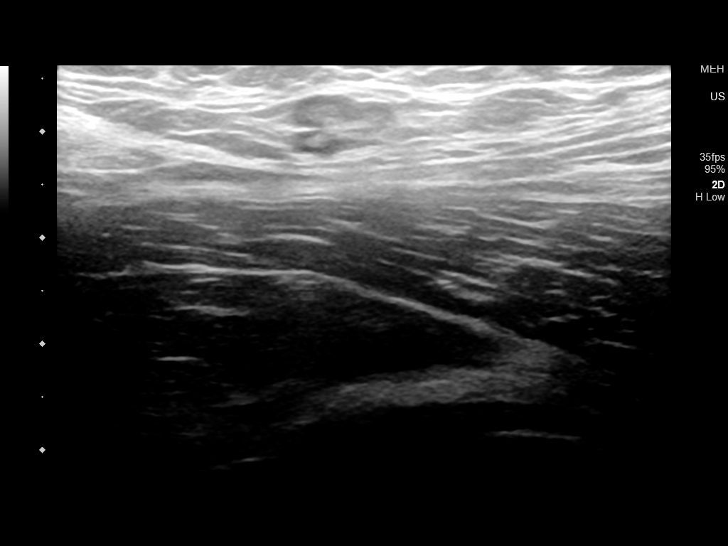
[im 19/26]
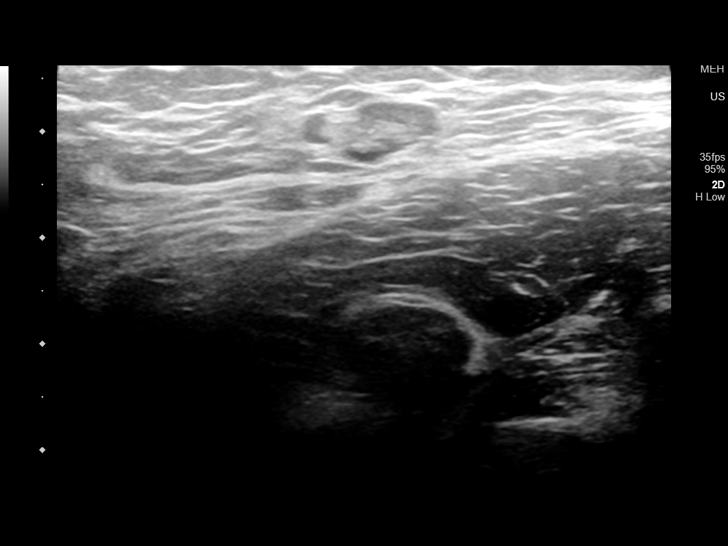
[im 21/26]
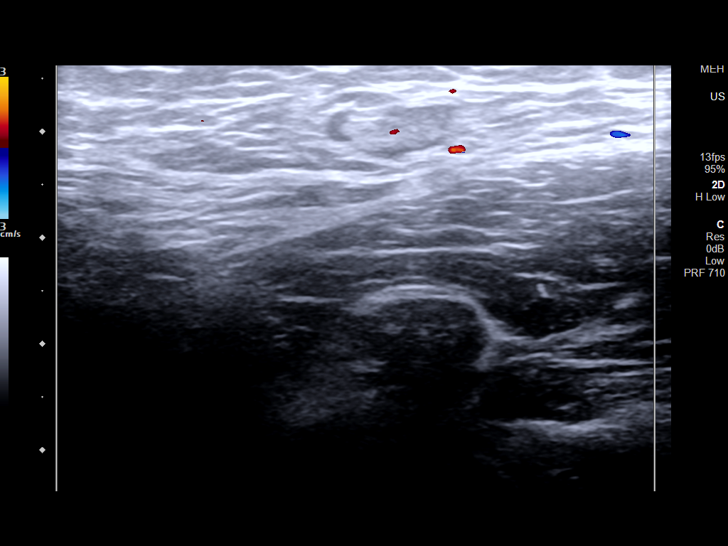
[im 23/26]
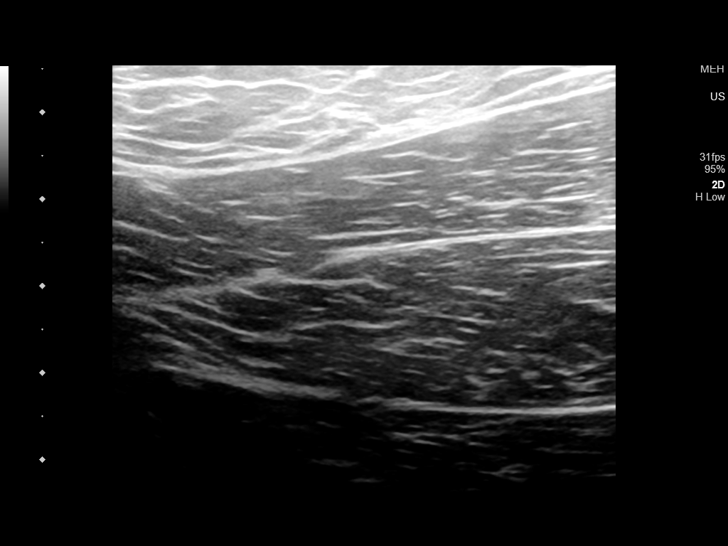
[im 26/26]
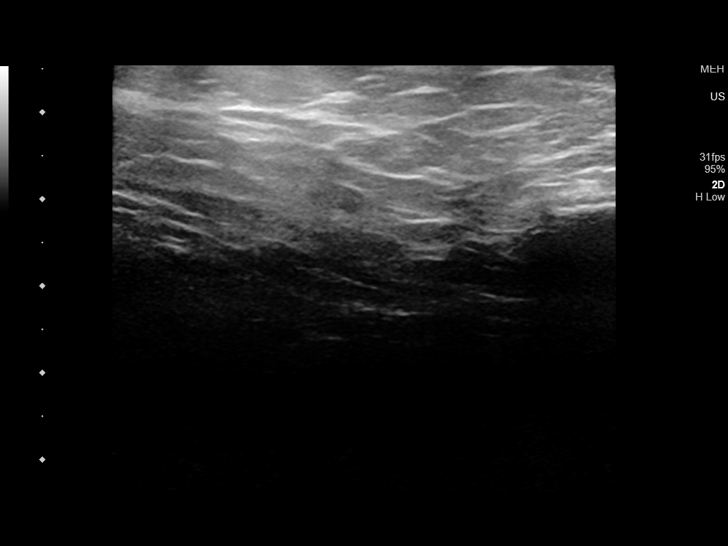

[14 of 25 positions shown; findings below may reference images not displayed]

FINDINGS: Evaluation of both the the right left groin did not demonstrate
sonographic evidence for an inguinal hernia. Multiple
morphologically normal appearing inguinal lymph nodes are noted.
These are of doubtful clinical significance. There was no mass or
fluid collection.
IMPRESSION: No inguinal hernia identified bilaterally.
